# Patient Record
Sex: Male | Born: 1935
Health system: Southern US, Community
[De-identification: ages and names within clinical notes are randomized; demographics above are authoritative.]

## PROBLEM LIST (undated history)

## (undated) DIAGNOSIS — N135 Crossing vessel and stricture of ureter without hydronephrosis: Secondary | ICD-10-CM

## (undated) DIAGNOSIS — E785 Hyperlipidemia, unspecified: Secondary | ICD-10-CM

## (undated) DIAGNOSIS — Z9359 Other cystostomy status: Secondary | ICD-10-CM

## (undated) DIAGNOSIS — C4490 Unspecified malignant neoplasm of skin, unspecified: Secondary | ICD-10-CM

## (undated) DIAGNOSIS — J189 Pneumonia, unspecified organism: Secondary | ICD-10-CM

## (undated) DIAGNOSIS — C61 Malignant neoplasm of prostate: Secondary | ICD-10-CM

## (undated) DIAGNOSIS — I8393 Asymptomatic varicose veins of bilateral lower extremities: Secondary | ICD-10-CM

## (undated) DIAGNOSIS — I714 Abdominal aortic aneurysm, without rupture, unspecified: Secondary | ICD-10-CM

## (undated) DIAGNOSIS — K862 Cyst of pancreas: Secondary | ICD-10-CM

## (undated) DIAGNOSIS — N200 Calculus of kidney: Secondary | ICD-10-CM

## (undated) HISTORY — PX: CATARACT EXTRACTION W/ INTRAOCULAR LENS IMPLANT: SHX1309

## (undated) HISTORY — DX: Malignant neoplasm of prostate: C61

## (undated) HISTORY — PX: TONSILLECTOMY: SUR1361

## (undated) HISTORY — PX: PROSTATE SURGERY: SHX751

## (undated) HISTORY — PX: APPENDECTOMY: SHX54

## (undated) HISTORY — PX: CHOLECYSTECTOMY: SHX55

## (undated) HISTORY — PX: SIGMOIDOSCOPY: SUR1295

## (undated) HISTORY — DX: Unspecified malignant neoplasm of skin, unspecified: C44.90

---

## 2003-05-17 ENCOUNTER — Other Ambulatory Visit: Payer: Self-pay

## 2004-02-13 ENCOUNTER — Other Ambulatory Visit: Payer: Self-pay

## 2004-02-20 ENCOUNTER — Ambulatory Visit: Payer: Self-pay | Admitting: Specialist

## 2005-12-06 ENCOUNTER — Inpatient Hospital Stay: Payer: Self-pay | Admitting: General Surgery

## 2005-12-06 ENCOUNTER — Other Ambulatory Visit: Payer: Self-pay

## 2005-12-07 ENCOUNTER — Other Ambulatory Visit: Payer: Self-pay

## 2006-12-19 ENCOUNTER — Ambulatory Visit: Payer: Self-pay | Admitting: Specialist

## 2008-09-13 ENCOUNTER — Emergency Department: Payer: Self-pay | Admitting: Urology

## 2011-03-24 ENCOUNTER — Ambulatory Visit: Payer: Self-pay | Admitting: Ophthalmology

## 2011-09-17 DIAGNOSIS — R32 Unspecified urinary incontinence: Secondary | ICD-10-CM | POA: Insufficient documentation

## 2011-09-17 DIAGNOSIS — Z87442 Personal history of urinary calculi: Secondary | ICD-10-CM | POA: Insufficient documentation

## 2011-09-17 DIAGNOSIS — Z8546 Personal history of malignant neoplasm of prostate: Secondary | ICD-10-CM | POA: Insufficient documentation

## 2011-09-17 DIAGNOSIS — I8390 Asymptomatic varicose veins of unspecified lower extremity: Secondary | ICD-10-CM | POA: Insufficient documentation

## 2012-09-20 ENCOUNTER — Ambulatory Visit: Payer: Self-pay | Admitting: Ophthalmology

## 2012-09-20 DIAGNOSIS — I517 Cardiomegaly: Secondary | ICD-10-CM

## 2012-09-29 ENCOUNTER — Ambulatory Visit: Payer: Self-pay | Admitting: Ophthalmology

## 2012-10-18 DIAGNOSIS — K862 Cyst of pancreas: Secondary | ICD-10-CM | POA: Insufficient documentation

## 2013-10-03 DIAGNOSIS — R079 Chest pain, unspecified: Secondary | ICD-10-CM | POA: Insufficient documentation

## 2013-10-03 DIAGNOSIS — E782 Mixed hyperlipidemia: Secondary | ICD-10-CM | POA: Insufficient documentation

## 2013-10-03 DIAGNOSIS — R9431 Abnormal electrocardiogram [ECG] [EKG]: Secondary | ICD-10-CM | POA: Insufficient documentation

## 2013-10-03 DIAGNOSIS — I10 Essential (primary) hypertension: Secondary | ICD-10-CM | POA: Insufficient documentation

## 2013-10-03 DIAGNOSIS — R0681 Apnea, not elsewhere classified: Secondary | ICD-10-CM | POA: Insufficient documentation

## 2014-01-09 DIAGNOSIS — N99114 Postprocedural urethral stricture, male, unspecified: Secondary | ICD-10-CM | POA: Insufficient documentation

## 2014-09-08 NOTE — Op Note (Signed)
PATIENT NAME:  Brady Hanna, Brady Hanna MR#:  [id number] DATE OF BIRTH:  [birth year]  DATE OF PROCEDURE:  09/29/2012  PREOPERATIVE DIAGNOSIS: Cataract, left eye.   POSTOPERATIVE DIAGNOSIS: Cataract, left eye.   PROCEDURE PERFORMED: Extracapsular cataract extraction using phacoemulsification with placement Alcon [id number] 23.5-diopter posterior chamber lens, serial [id number].   SURGEON: Loura Back. Ellias Mcelreath, MD   ASSISTANT:  None.  ANESTHESIA: 4% lidocaine and 0.75% Marcaine in a 50:50 mixture with 10 units/mL of Hylenex added, given as a peribulbar.   ANESTHESIOLOGIST: Amy M. Rice, MD   COMPLICATIONS: None.   ESTIMATED BLOOD LOSS: Less than 1 mL.   DESCRIPTION OF PROCEDURE: The patient was brought to the operating room and given a peribulbar block.  The patient was then prepped and draped in the usual fashion.  The vertical rectus muscles were imbricated using 5-0 silk sutures.  These sutures were then clamped to the sterile drapes as bridle sutures.  A limbal peritomy was performed extending two clock hours and hemostasis was obtained with cautery.  A partial thickness scleral groove was made at the surgical limbus and dissected anteriorly in a lamellar dissection using an Alcon crescent knife.  The anterior chamber was entered supero-temporally with a Superblade and through the lamellar dissection with a 2.6 mm keratome.  DisCoVisc was used to replace the aqueous and a continuous tear capsulorrhexis was carried out.  Hydrodissection and hydrodelineation were carried out with balanced salt and a 27 gauge canula.  The nucleus was rotated to confirm the effectiveness of the hydrodissection.  Phacoemulsification was carried out using a divide-and-conquer technique.  Total ultrasound time was 1 minute and 13.7 seconds with an average power of 25.3 percent. CDE 32.52.  Irrigation/aspiration was used to remove the residual cortex.  DisCoVisc was used to inflate the capsule and the internal incision was  enlarged to 3 mm with the crescent knife.  The intraocular lens was folded and inserted into the capsular bag using the AcrySert delivery system.  Irrigation/aspiration was used to remove the residual DisCoVisc.  Miostat was injected into the anterior chamber through the paracentesis track to inflate the anterior chamber and induce miosis.  The wound was checked for leaks and none were found. The conjunctiva was closed with cautery and the bridle sutures were removed.  Two drops of 0.3% Vigamox were placed on the eye.   An eye shield was placed on the eye.  The patient was discharged to the recovery room in good condition.   ____________________________ Loura Back Shantinique Picazo, MD sad:OSi D: 09/29/2012 11:49:49 ET T: 09/29/2012 13:55:17 ET JOB#: [id number]  cc: Remo Lipps A. Bret Vanessen, MD, <Dictator> Martie Lee MD ELECTRONICALLY SIGNED 10/04/2012 14:11

## 2014-10-04 ENCOUNTER — Encounter
Admission: RE | Admit: 2014-10-04 | Discharge: 2014-10-04 | Disposition: A | Discharge status: Home / Self Care | Payer: Medicare Other | Source: Ambulatory Visit | Attending: Urology | Admitting: Urology

## 2014-10-04 DIAGNOSIS — N471 Phimosis: Secondary | ICD-10-CM | POA: Diagnosis present

## 2014-10-04 NOTE — Patient Instructions (Signed)
  Your procedure is scheduled on: Oct 09, 2014 Report to Same Day Surgery. To find out your arrival time please call [phone] between 1PM - 3PM on Oct 06, 2014.  Remember: Instructions that are not followed completely may result in serious medical risk, up to and including death, or upon the discretion of your surgeon and anesthesiologist your surgery may need to be rescheduled.    __x__ 1. Do not eat food or drink liquids after midnight. No gum chewing or hard candies.     ____ 2. No Alcohol for 24 hours before or after surgery.   ____ 3. Bring all medications with you on the day of surgery if instructed.    __x__ 4. Notify your doctor if there is any change in your medical condition     (cold, fever, infections).     Do not wear jewelry, make-up, hairpins, clips or nail polish.  Do not wear lotions, powders, or perfumes. You may wear deodorant.  Do not shave 48 hours prior to surgery. Men may shave face and neck.  Do not bring valuables to the hospital.    [facility] is not responsible for any belongings or valuables.               Contacts, dentures or bridgework may not be worn into surgery.  Leave your suitcase in the car. After surgery it may be brought to your room.  For patients admitted to the hospital, discharge time is determined by your treatment team.   Patients discharged the day of surgery will not be allowed to drive home.   Please read over the following fact sheets that you were given:      ____ Take these medicines the morning of surgery with A SIP OF WATER: NONE    ____ Fleet Enema (as directed)   ____ Use CHG Soap as directed  ____ Use inhalers on the day of surgery  ____ Stop metformin 2 days prior to surgery    ____ Take 1/2 of usual insulin dose the night before surgery and none on the morning of surgery.   ____ Stop Coumadin/Plavix/aspirin on May 15th, 2016.  ____ Stop Anti-inflammatories(Motrin, Advil, Aleve, Ibuprofen) on Oct 04, 2014.   ____ Stop supplements until after surgery.    ____ Bring C-Pap to the hospital.

## 2014-10-06 MED ORDER — LACTATED RINGERS IV SOLN: INTRAVENOUS | Status: DC

## 2014-10-09 ENCOUNTER — Encounter: Payer: Self-pay | Admitting: *Deleted

## 2014-10-09 ENCOUNTER — Ambulatory Visit: Payer: Medicare Other | Admitting: Anesthesiology

## 2014-10-09 ENCOUNTER — Ambulatory Visit
Admission: RE | Admit: 2014-10-09 | Discharge: 2014-10-09 | Disposition: A | Discharge status: Home / Self Care | Payer: Medicare Other | Source: Ambulatory Visit | Attending: Urology | Admitting: Urology

## 2014-10-09 ENCOUNTER — Encounter
Admission: RE | Disposition: A | Discharge status: Home / Self Care | Payer: Self-pay | Source: Ambulatory Visit | Attending: Urology

## 2014-10-09 DIAGNOSIS — Z7982 Long term (current) use of aspirin: Secondary | ICD-10-CM | POA: Diagnosis not present

## 2014-10-09 DIAGNOSIS — R339 Retention of urine, unspecified: Secondary | ICD-10-CM | POA: Insufficient documentation

## 2014-10-09 DIAGNOSIS — D62 Acute posthemorrhagic anemia: Secondary | ICD-10-CM

## 2014-10-09 DIAGNOSIS — Z885 Allergy status to narcotic agent status: Secondary | ICD-10-CM | POA: Insufficient documentation

## 2014-10-09 DIAGNOSIS — N359 Urethral stricture, unspecified: Secondary | ICD-10-CM | POA: Diagnosis not present

## 2014-10-09 DIAGNOSIS — Z9049 Acquired absence of other specified parts of digestive tract: Secondary | ICD-10-CM | POA: Diagnosis not present

## 2014-10-09 DIAGNOSIS — Z9849 Cataract extraction status, unspecified eye: Secondary | ICD-10-CM | POA: Insufficient documentation

## 2014-10-09 DIAGNOSIS — Z882 Allergy status to sulfonamides status: Secondary | ICD-10-CM | POA: Diagnosis not present

## 2014-10-09 DIAGNOSIS — Z79899 Other long term (current) drug therapy: Secondary | ICD-10-CM | POA: Diagnosis not present

## 2014-10-09 DIAGNOSIS — Z412 Encounter for routine and ritual male circumcision: Secondary | ICD-10-CM

## 2014-10-09 DIAGNOSIS — N3040 Irradiation cystitis without hematuria: Secondary | ICD-10-CM | POA: Insufficient documentation

## 2014-10-09 DIAGNOSIS — Z8546 Personal history of malignant neoplasm of prostate: Secondary | ICD-10-CM | POA: Insufficient documentation

## 2014-10-09 DIAGNOSIS — N471 Phimosis: Secondary | ICD-10-CM | POA: Insufficient documentation

## 2014-10-09 HISTORY — PX: CYSTOSCOPY WITH DIRECT VISION INTERNAL URETHROTOMY: SHX6637

## 2014-10-09 HISTORY — PX: CIRCUMCISION: SHX1350

## 2014-10-09 SURGERY — CYSTOSCOPY, WITH DIRECT VISION INTERNAL URETHROTOMY
Anesthesia: General | Wound class: Clean Contaminated

## 2014-10-09 MED ORDER — ACETAMINOPHEN 10 MG/ML IV SOLN
INTRAVENOUS | Status: AC
  Filled 2014-10-09: qty 100

## 2014-10-09 MED ORDER — LIDOCAINE HCL (CARDIAC) 20 MG/ML IV SOLN
INTRAVENOUS | Status: DC | PRN
  Administered 2014-10-09: 100 mg via INTRAVENOUS

## 2014-10-09 MED ORDER — BELLADONNA ALKALOIDS-OPIUM 16.2-60 MG RE SUPP
RECTAL | Status: AC
  Filled 2014-10-09: qty 1

## 2014-10-09 MED ORDER — FENTANYL CITRATE (PF) 100 MCG/2ML IJ SOLN
INTRAMUSCULAR | Status: DC | PRN
  Administered 2014-10-09 (×2): 50 ug via INTRAVENOUS

## 2014-10-09 MED ORDER — ACETAMINOPHEN 10 MG/ML IV SOLN
INTRAVENOUS | Status: DC | PRN
  Administered 2014-10-09: 1000 mg via INTRAVENOUS

## 2014-10-09 MED ORDER — BELLADONNA ALKALOIDS-OPIUM 16.2-60 MG RE SUPP
RECTAL | Status: DC | PRN
  Administered 2014-10-09: 1 via RECTAL

## 2014-10-09 MED ORDER — STERILE WATER FOR IRRIGATION IR SOLN
Status: DC | PRN
  Administered 2014-10-09: 300 mL

## 2014-10-09 MED ORDER — GLYCOPYRROLATE 0.2 MG/ML IJ SOLN
INTRAMUSCULAR | Status: DC | PRN
  Administered 2014-10-09: 0.2 mg via INTRAVENOUS

## 2014-10-09 MED ORDER — LIDOCAINE HCL 1 % IJ SOLN
INTRAMUSCULAR | Status: DC | PRN
  Administered 2014-10-09: 5 mL

## 2014-10-09 MED ORDER — ONDANSETRON HCL 4 MG/2ML IJ SOLN: 4.0000 mg | Freq: Once | INTRAMUSCULAR | Status: DC | PRN

## 2014-10-09 MED ORDER — LACTATED RINGERS IV SOLN
INTRAVENOUS | Status: DC | PRN
  Administered 2014-10-09: 13:00:00 via INTRAVENOUS

## 2014-10-09 MED ORDER — CEFTRIAXONE SODIUM IN DEXTROSE 20 MG/ML IV SOLN
1.0000 g | Freq: Once | INTRAVENOUS | Status: AC
  Administered 2014-10-09: 1 g via INTRAVENOUS
  Filled 2014-10-09 (×2): qty 50

## 2014-10-09 MED ORDER — BUPIVACAINE HCL 0.5 % IJ SOLN
INTRAMUSCULAR | Status: DC | PRN
  Administered 2014-10-09: 30 mL

## 2014-10-09 MED ORDER — MIDAZOLAM HCL 2 MG/2ML IJ SOLN
INTRAMUSCULAR | Status: DC | PRN
  Administered 2014-10-09: 2 mg via INTRAVENOUS

## 2014-10-09 MED ORDER — LIDOCAINE HCL (PF) 1 % IJ SOLN
INTRAMUSCULAR | Status: AC
  Filled 2014-10-09: qty 30

## 2014-10-09 MED ORDER — PROPOFOL 10 MG/ML IV BOLUS
INTRAVENOUS | Status: DC | PRN
  Administered 2014-10-09: 200 mg via INTRAVENOUS

## 2014-10-09 MED ORDER — SODIUM CHLORIDE 0.9 % IV SOLN
INTRAVENOUS | Status: DC
  Administered 2014-10-09: 13:00:00 via INTRAVENOUS

## 2014-10-09 MED ORDER — FAMOTIDINE 20 MG PO TABS
ORAL_TABLET | ORAL | Status: AC
  Administered 2014-10-09: 20 mg via ORAL
  Filled 2014-10-09: qty 1

## 2014-10-09 MED ORDER — OXYCODONE-ACETAMINOPHEN 5-325 MG PO TABS: 1.0000 | ORAL_TABLET | Freq: Four times a day (QID) | ORAL | Status: DC | PRN

## 2014-10-09 MED ORDER — ONDANSETRON HCL 4 MG/2ML IJ SOLN
INTRAMUSCULAR | Status: DC | PRN
  Administered 2014-10-09: 4 mg via INTRAVENOUS

## 2014-10-09 MED ORDER — FAMOTIDINE 20 MG PO TABS
20.0000 mg | ORAL_TABLET | Freq: Once | ORAL | Status: AC
  Administered 2014-10-09: 20 mg via ORAL

## 2014-10-09 MED ORDER — FENTANYL CITRATE (PF) 100 MCG/2ML IJ SOLN: 25.0000 ug | INTRAMUSCULAR | Status: DC | PRN

## 2014-10-09 MED ORDER — BUPIVACAINE HCL (PF) 0.5 % IJ SOLN
INTRAMUSCULAR | Status: AC
  Filled 2014-10-09: qty 30

## 2014-10-09 SURGICAL SUPPLY — 40 items
BAG DRAIN CYSTO-URO LG1000N (MISCELLANEOUS) ×3 IMPLANT
BAG URO DRAIN 2000ML W/SPOUT (MISCELLANEOUS) ×3 IMPLANT
BLADE SURG 15 STRL LF DISP TIS (BLADE) ×1 IMPLANT
BLADE SURG 15 STRL SS (BLADE) ×2
CANISTER SUCT 1200ML W/VALVE (MISCELLANEOUS) ×3 IMPLANT
CATH FOLEY 2WAY  5CC 20FR SIL (CATHETERS) ×2
CATH FOLEY 2WAY 5CC 20FR SIL (CATHETERS) ×1 IMPLANT
DRAPE PED LAPAROTOMY (DRAPES) ×3 IMPLANT
ELECT CAUTERY NEEDLE TIP 1.0 (MISCELLANEOUS) ×3
ELECTRODE CAUTERY NEDL TIP 1.0 (MISCELLANEOUS) ×1 IMPLANT
GAUZE PETROLATUM 1 X8 (GAUZE/BANDAGES/DRESSINGS) ×3 IMPLANT
GAUZE STRETCH 2X75IN STRL (MISCELLANEOUS) ×3 IMPLANT
GLOVE BIO SURGEON STRL SZ7 (GLOVE) ×6 IMPLANT
GLOVE BIO SURGEON STRL SZ7.5 (GLOVE) ×6 IMPLANT
GOWN STRL REUS W/ TWL LRG LVL3 (GOWN DISPOSABLE) ×2 IMPLANT
GOWN STRL REUS W/ TWL XL LVL3 (GOWN DISPOSABLE) ×1 IMPLANT
GOWN STRL REUS W/TWL LRG LVL3 (GOWN DISPOSABLE) ×4
GOWN STRL REUS W/TWL XL LVL3 (GOWN DISPOSABLE) ×2
JELLY LUB 2OZ STRL (MISCELLANEOUS) ×2
JELLY LUBE 2OZ STRL (MISCELLANEOUS) ×1 IMPLANT
KIT RM TURNOVER CYSTO AR (KITS) ×3 IMPLANT
KIT RM TURNOVER STRD PROC AR (KITS) ×3 IMPLANT
LABEL OR SOLS (LABEL) ×3 IMPLANT
NDL SAFETY 25GX1.5 (NEEDLE) ×3 IMPLANT
NS IRRIG 500ML POUR BTL (IV SOLUTION) ×3 IMPLANT
PACK BASIN MINOR ARMC (MISCELLANEOUS) ×3 IMPLANT
PACK CYSTO AR (MISCELLANEOUS) ×3 IMPLANT
PAD GROUND ADULT SPLIT (MISCELLANEOUS) ×3 IMPLANT
PREP PVP WINGED SPONGE (MISCELLANEOUS) ×3 IMPLANT
SENSORWIRE 0.038 NOT ANGLED (WIRE) ×3
SET CYSTO W/LG BORE CLAMP LF (SET/KITS/TRAYS/PACK) ×3 IMPLANT
SOL PREP PVP 2OZ (MISCELLANEOUS) ×3
SOLUTION PREP PVP 2OZ (MISCELLANEOUS) ×1 IMPLANT
STRAP SAFETY BODY (MISCELLANEOUS) ×3 IMPLANT
SUT CHROMIC 3 0 SH 27 (SUTURE) ×9 IMPLANT
SYRINGE 10CC LL (SYRINGE) ×3 IMPLANT
SYRINGE IRR TOOMEY STRL 70CC (SYRINGE) ×3 IMPLANT
WATER STERILE IRR 1000ML POUR (IV SOLUTION) ×3 IMPLANT
WATER STERILE IRR 3000ML UROMA (IV SOLUTION) ×3 IMPLANT
WIRE SENSOR 0.038 NOT ANGLED (WIRE) ×1 IMPLANT

## 2014-10-09 NOTE — Progress Notes (Signed)
preop report given to Dorene Sorrow, RN by Phillips Grout, RN

## 2014-10-09 NOTE — H&P (Signed)
Urology Admission H&P  Chief Complaint: phimosis and urinary retention with a stricture at bulbous urethra  History of Present Illness: several months of retention and slowly tightening  Past Medical History  Diagnosis Date  . Cancer     prostate   Past Surgical History  Procedure Laterality Date  . Cholecystectomy    . Prostate surgery    . Appendectomy    . Tonsillectomy    . Cataract extraction w/ intraocular lens implant Bilateral 2010 & 2012    Home Medications:  Prescriptions prior to admission  Medication Sig Dispense Refill Last Dose  . aspirin 325 MG tablet Take 325 mg by mouth every evening.   10/01/2014  . ibuprofen (ADVIL,MOTRIN) 200 MG tablet Take 200 mg by mouth every 6 (six) hours as needed for mild pain.   10/01/2014   Allergies:  Allergies  Allergen Reactions  . Morphine And Related Itching  . Sulphadimidine [Sulfamethazine] Itching    History reviewed. No pertinent family history. Social History:  reports that he has never smoked. He does not have any smokeless tobacco history on file. He reports that he does not drink alcohol or use illicit drugs.  ROS  Physical Exam:  Vital signs in last 24 hours: Temp:  [98.6 F (37 C)] 98.6 F (37 C) (05/23 1223) Pulse Rate:  [77] 77 (05/23 1223) Resp:  [18] 18 (05/23 1223) BP: (183)/(94) 183/94 mmHg (05/23 1223) SpO2:  [98 %] 98 % (05/23 1223) Weight:  [111.131 kg (245 lb)] 111.131 kg (245 lb) (05/23 1223) Physical Exam  Laboratory Data:  No results found for this or any previous visit (from the past 24 hour(s)). No results found for this or any previous visit (from the past 240 hour(s)). Creatinine: No results for input(s): CREATININE in the last 168 hours. Baseline Creatinine:   Impression/Assessment:  Phimosis urinary retention  Plan:  Cysto dviu and circumcision  Collier Flowers 10/09/2014, 12:47 PM

## 2014-10-09 NOTE — Transfer of Care (Signed)
Immediate Anesthesia Transfer of Care Note  Patient: Brady Hanna  Procedure(s) Performed: Procedure(s): CYSTOSCOPY WITH DIRECT VISION INTERNAL URETHROTOMY (N/A) CIRCUMCISION ADULT (N/A)  Patient Location: PACU  Anesthesia Type:General  Level of Consciousness: sedated and responds to stimulation  Airway & Oxygen Therapy: Patient Spontanous Breathing and Patient connected to face mask oxygen  Post-op Assessment: Report given to RN and Post -op Vital signs reviewed and stable  Post vital signs: Reviewed and stable  Last Vitals:  Filed Vitals:   10/09/14 1424  BP: 112/69  Pulse: 76  Temp: 36.6 C  Resp: 19    Complications: No apparent anesthesia complications

## 2014-10-09 NOTE — Anesthesia Procedure Notes (Signed)
Procedure Name: LMA Insertion Date/Time: 10/09/2014 1:27 PM Performed by: Doreen Salvage Pre-anesthesia Checklist: Patient identified, Patient being monitored, Timeout performed, Emergency Drugs available and Suction available Patient Re-evaluated:Patient Re-evaluated prior to inductionOxygen Delivery Method: Circle system utilized Preoxygenation: Pre-oxygenation with 100% oxygen Intubation Type: IV induction Ventilation: Mask ventilation without difficulty LMA: LMA inserted LMA Size: 4.5 Tube type: Oral Number of attempts: 1 Placement Confirmation: positive ETCO2 and breath sounds checked- equal and bilateral Tube secured with: Tape Dental Injury: Teeth and Oropharynx as per pre-operative assessment

## 2014-10-09 NOTE — Op Note (Signed)
Preoperative diagnosis: Phimosis urethral stricture Postoperative diagnosis: Same  Procedure: Circumcision Direct Vision Internal Urethrotomy Surgeon: Lillette Boxer. Dahlstedt, MD. Anesthesia: Gen. with penile block Specimen: foreskin Complications: none PHX:[id number]  Indications: The patient was recently evaluated for phimosis. All risks and benefits of circumcision discussed. Full informed consent obtained. The patient now presents for definitive procedure.  Technique and findings: The patient was brought to the operating room. Successful induction of general anesthesia.  The patient was then prepped and draped in usual manner. Appropriate surgical timeout was performed. A penile block was then performed with 10cc of quarter percent plain Marcaine. Proximal and distal incision sites were marked with a pen, and appropriate circumferential incisions were created. The sleeve of redundant skin was removed with the bovie. Hemostatis was achieved using electrocautery.Quadrant sutures were placed with interrupted 4-0 chromic, with the phrenular suture being a "U" stitch. In between, the same 4-0 chromic was used to re approximate the skin edges with a running simple stitch.  The incision was wrapped with Xeroform gauze, a plain guaze wrap and Coban dressing. The patient was brought to recovery room in stable condition having had no obvious complications or problems. Sponge and needle counts were correct. Then a 20 fr direct vision internal urethrotomy scope is inserted into the urethra with the patient in the supine lithotomy position.  A membranous urethral stricture is seen with a pinhole opening  A 0.35 sensor wire is placed thru this stricture into the bladder.  Then a sharp sasche knife incision is made into the stricture and the scope is able to view the empty prostate fossa.  Radiation cystitis is seen  But no bladder tumors.  The bladder is emptied and scope withdrawn.  28ml of marcaine is injected into the  bladder.  A BandO suppository is placed into the rectum  An irregularity is felt in the rectum anteriorly and should be checked in the future

## 2014-10-09 NOTE — Anesthesia Postprocedure Evaluation (Signed)
  Anesthesia Post-op Note  Patient: Brady Hanna  Procedure(s) Performed: Procedure(s): CYSTOSCOPY WITH DIRECT VISION INTERNAL URETHROTOMY (N/A) CIRCUMCISION ADULT (N/A)  Anesthesia type:General  Patient location: PACU  Post pain: Pain level controlled  Post assessment: Post-op Vital signs reviewed, Patient's Cardiovascular Status Stable, Respiratory Function Stable, Patent Airway and No signs of Nausea or vomiting  Post vital signs: Reviewed and stable  Last Vitals:  Filed Vitals:   10/09/14 1425  BP:   Pulse: 74  Temp: 36.5 C  Resp: 19    Level of consciousness: awake, alert  and patient cooperative  Complications: No apparent anesthesia complications

## 2014-10-09 NOTE — Discharge Instructions (Addendum)
Ice x 48 hours  And remove dressing in 52 AMBULATORY SURGERY  DISCHARGE INSTRUCTIONS   1) The drugs that you were given will stay in your system until tomorrow so for the next 24 hours you should not:  A) Drive an automobile B) Make any legal decisions C) Drink any alcoholic beverage   2) You may resume regular meals tomorrow.  Today it is better to start with liquids and gradually work up to solid foods.  You may eat anything you prefer, but it is better to start with liquids, then soup and crackers, and gradually work up to solid foods.   3) Please notify your doctor immediately if you have any unusual bleeding, trouble breathing, redness and pain at the surgery site, drainage, fever, or pain not relieved by medication. 4)   5) Your post-operative visit with Dr.    George Ina                                 is: Date:                        Time:    Please call to schedule your post-operative visit.  6) Additional Instructions: hours  Remove the foley in 48 hours at our office 10/20/14 @ 10:15

## 2014-10-09 NOTE — Anesthesia Preprocedure Evaluation (Addendum)
Anesthesia Evaluation  Patient identified by MRN, date of birth, ID band Patient awake    Reviewed: Allergy & Precautions, H&P , NPO status , Patient's Chart, lab work & pertinent test results  Airway Mallampati: III  TM Distance: >3 FB     Dental  (+) Chipped, Missing   Pulmonary          Cardiovascular     Neuro/Psych    GI/Hepatic   Endo/Other    Renal/GU      Musculoskeletal   Abdominal   Peds  Hematology   Anesthesia Other Findings Prostate cancer.  Reproductive/Obstetrics                             Anesthesia Physical Anesthesia Plan  ASA: III  Anesthesia Plan: General   Post-op Pain Management:    Induction: Intravenous  Airway Management Planned: LMA  Additional Equipment:   Intra-op Plan:   Post-operative Plan:   Informed Consent: I have reviewed the patients History and Physical, chart, labs and discussed the procedure including the risks, benefits and alternatives for the proposed anesthesia with the patient or authorized representative who has indicated his/her understanding and acceptance.     Plan Discussed with: CRNA  Anesthesia Plan Comments:        Anesthesia Quick Evaluation

## 2014-10-10 ENCOUNTER — Encounter: Payer: Self-pay | Admitting: Urology

## 2014-10-12 ENCOUNTER — Encounter: Payer: Self-pay | Admitting: *Deleted

## 2014-10-12 ENCOUNTER — Other Ambulatory Visit: Payer: Self-pay | Admitting: *Deleted

## 2014-10-20 ENCOUNTER — Ambulatory Visit (INDEPENDENT_AMBULATORY_CARE_PROVIDER_SITE_OTHER): Payer: Medicare Other | Admitting: Urology

## 2014-10-20 ENCOUNTER — Encounter: Payer: Self-pay | Admitting: Urology

## 2014-10-20 ENCOUNTER — Ambulatory Visit: Payer: Self-pay | Admitting: Urology

## 2014-10-20 VITALS — BP 167/88 | HR 75

## 2014-10-20 DIAGNOSIS — R32 Unspecified urinary incontinence: Secondary | ICD-10-CM

## 2014-10-20 DIAGNOSIS — Z9889 Other specified postprocedural states: Secondary | ICD-10-CM

## 2014-10-20 LAB — BLADDER SCAN AMB NON-IMAGING: Scan Result: 0

## 2014-10-20 NOTE — Progress Notes (Signed)
10/20/2014 10:16 AM   Brady Hanna [birth year] [id number]  Referring provider: Kirk Ruths, MD [place], [place] [address]  Chief Complaint  Patient presents with  . Post-op Problem    pt concern that his incsion site is not healing right,because of the hair. He has a knot on the area thats causing him difficulty urinating.    HPI: [name] is a one-week postop circumcision with complaint of slow healing and a hematoma. He has an organized hematoma on the right side of his incision. He is angry because I did not shave his hair for circumcision. It was no need to shave his hair for circumcision and this hematoma will resolve. He still has some scarring on the frenular area otherwise circumcision actually looks very well-healed and hematoma longer is having the edema and swelling where his foreskin was constantly under trip from his incontinence. He is not voiding well so I think he is probably had a re-formation of his scar tissue in his urethra. This stricture is side at the level of the bulbous urethra. A post void residual is obtained today  post void residual is 10-15 mL   PMH: Past Medical History  Diagnosis Date  . Cancer     prostate    Surgical History: Past Surgical History  Procedure Laterality Date  . Cholecystectomy    . Prostate surgery    . Appendectomy    . Tonsillectomy    . Cataract extraction w/ intraocular lens implant Bilateral 2010 & 2012  . Cystoscopy with direct vision internal urethrotomy N/A 10/09/2014    Procedure: CYSTOSCOPY WITH DIRECT VISION INTERNAL URETHROTOMY;  Surgeon: Brady Flowers, MD;  Location: [facility] ORS;  Service: Urology;  Laterality: N/A;  . Circumcision N/A 10/09/2014    Procedure: CIRCUMCISION ADULT;  Surgeon: Brady Flowers, MD;  Location: [facility] ORS;  Service: Urology;  Laterality: N/A;    Home Medications:    Medication List       This list is accurate as of: 10/20/14 10:16 AM.  Always use your most recent  med list.               aspirin 325 MG tablet  Take 325 mg by mouth every evening.     fluticasone 50 MCG/ACT nasal spray  Commonly known as:  FLONASE  Place into the nose.     ibuprofen 200 MG tablet  Commonly known as:  ADVIL,MOTRIN  Take 200 mg by mouth every 6 (six) hours as needed for mild pain.     oxyCODONE-acetaminophen 5-325 MG per tablet  Commonly known as:  ROXICET  Take 1 tablet by mouth every 6 (six) hours as needed for severe pain.     sildenafil 100 MG tablet  Commonly known as:  VIAGRA  Take by mouth.        Allergies:  Allergies  Allergen Reactions  . Codeine     "makes him agitated" per daughter  . Morphine And Related Itching  . Sulphadimidine [Sulfamethazine] Itching    Family History: Family History  Problem Relation Age of Onset  . Congestive Heart Failure Father   . Rectal cancer Sister   . Prostate cancer Brother     Social History:  reports that he has never smoked. He does not have any smokeless tobacco history on file. He reports that he does not drink alcohol or use illicit drugs.  ROS: Urological Symptom Review  Patient is experiencing the following symptoms: Leakage of  urine Weak stream   Review of Systems  Gastrointestinal (upper)  : Negative for upper GI symptoms  Gastrointestinal (lower) : Negative for lower GI symptoms  Constitutional : Negative for symptoms  Skin: Negative for skin symptoms  Eyes: Negative for eye symptoms  Ear/Nose/Throat : Negative for Ear/Nose/Throat symptoms  Hematologic/Lymphatic: Negative for Hematologic/Lymphatic symptoms  Cardiovascular : Negative for cardiovascular symptoms  Respiratory : Negative for respiratory symptoms  Endocrine: Negative for endocrine symptoms  Musculoskeletal: Negative for musculoskeletal symptoms  Neurological: Negative for neurological symptoms  Psychologic: Negative for psychiatric symptoms   Physical Exam: BP 167/88 mmHg  Pulse 75    Constitutional:  Alert and oriented, No acute distress. HEENT: Brady Hanna AT, moist mucus membranes.  Trachea midline, no masses. Cardiovascular: No clubbing, cyanosis, or edema. Respiratory: Normal respiratory effort, no increased work of breathing. GI: Abdomen is soft, nontender, nondistended, no abdominal masses GU: No CVA tenderness. He has good healing of his circumcision site with small resolving hematoma on the right side. With his postvoid residual being minimal patient has true incontinence. Skin: No rashes, bruises or suspicious lesions. Lymph: No cervical or inguinal adenopathy. Neurologic: Grossly intact, no focal deficits, moving all 4 extremities. Psychiatric: Normal mood and affect.  Laboratory Data: No results found for: WBC, HGB, HCT, MCV, PLT  No results found for: CREATININE  No results found for: PSA  No results found for: TESTOSTERONE  No results found for: HGBA1C  Urinalysis No results found for: COLORURINE, APPEARANCEUR, LABSPEC, PHURINE, GLUCOSEU, HGBUR, BILIRUBINUR, KETONESUR, PROTEINUR, UROBILINOGEN, NITRITE, LEUKOCYTESUR  Pertinent Imaging: I'll  Assessment & Plan:  I discussed with this patient the need for another opinion regarding his incontinence I'll follow-up with him in a month for reevaluation of his well-healing circumcision site. Patient states that he'll wait for a month until he sees me in a decision about whether to see a another incontinence specialist  There are no diagnoses linked to this encounter.  No Follow-up on file.  [place] [address], [place], [place] [address] [phone]

## 2014-11-21 ENCOUNTER — Encounter: Payer: Self-pay | Admitting: Urology

## 2014-11-21 ENCOUNTER — Ambulatory Visit (INDEPENDENT_AMBULATORY_CARE_PROVIDER_SITE_OTHER): Payer: Medicare Other | Admitting: Urology

## 2014-11-21 VITALS — BP 109/72 | HR 111 | Ht 72.0 in | Wt 240.7 lb

## 2014-11-21 DIAGNOSIS — N359 Urethral stricture, unspecified: Secondary | ICD-10-CM | POA: Diagnosis not present

## 2014-11-21 DIAGNOSIS — N3946 Mixed incontinence: Secondary | ICD-10-CM

## 2014-11-21 LAB — BLADDER SCAN AMB NON-IMAGING

## 2014-11-21 NOTE — Progress Notes (Signed)
11/21/2014 3:23 PM   [name] 04/21/1931 [id number]  Referring provider: Kirk Ruths, MD [place],  [id number]  Chief Complaint  Patient presents with  . Urethral Stricture    HPI: Patient has a circumcision site that is healing well. No scar tissue was present which was his complaint on the last visit. He still feels his penis is retracted into his mons pubis but this is only because there is no skin left around the glans. He still is totally incontinent when she has been since surgery for his prostate cancer. Patient refuses a sphincter or a workup for possible stress incontinence surgery for a male secondarily is complaining of urgency and frequency at night 4 to combat this I have placed him on a regimen of 10 mg Vesicare at at bedtime and given him samples for a months worth of this and will see him at the end of that   PMH: Past Medical History  Diagnosis Date  . Prostate cancer     prostate  . Skin cancer     ear    Surgical History: Past Surgical History  Procedure Laterality Date  . Cholecystectomy    . Prostate surgery    . Appendectomy    . Tonsillectomy    . Cataract extraction w/ intraocular lens implant Bilateral 2010 & 2012  . Cystoscopy with direct vision internal urethrotomy N/A 10/09/2014    Procedure: CYSTOSCOPY WITH DIRECT VISION INTERNAL URETHROTOMY;  Surgeon: Collier Flowers, MD;  Location: [facility];  Service: Urology;  Laterality: N/A;  . Circumcision N/A 10/09/2014    Procedure: CIRCUMCISION ADULT;  Surgeon: Collier Flowers, MD;  Location: [facility];  Service: Urology;  Laterality: N/A;    Home Medications:    Medication List       This list is accurate as of: 11/21/14  3:23 PM.  Always use your most recent med list.               aspirin 325 MG tablet  Take 325 mg by mouth every evening.     fluticasone 50 MCG/ACT nasal spray  Commonly known as:  FLONASE  Place into the nose.     ibuprofen 200 MG tablet   Commonly known as:  ADVIL,MOTRIN  Take 200 mg by mouth every 6 (six) hours as needed for mild pain.     sildenafil 100 MG tablet  Commonly known as:  VIAGRA  Take by mouth.        Allergies:  Allergies  Allergen Reactions  . Codeine     "makes him agitated" per daughter  . Morphine And Related Itching  . Sulphadimidine [Sulfamethazine] Itching    Family History: Family History  Problem Relation Age of Onset  . Congestive Heart Failure Father   . Rectal cancer Sister   . Prostate cancer Brother     Social History:  reports that he has never smoked. He does not have any smokeless tobacco history on file. He reports that he does not drink alcohol or use illicit drugs.  FFM:[id number] Frequent Urination?: No Hard to postpone urination?: No Burning/pain with urination?: No Get up at night to urinate?: Yes Leakage of urine?: Yes Urine stream starts and stops?: Yes Trouble starting stream?: Yes Do you have to strain to urinate?: No Blood in urine?: No Urinary tract infection?: No Sexually transmitted disease?: No Injury to kidneys or bladder?: No Painful intercourse?: No Weak stream?: Yes Erection problems?: Yes Penile pain?: No  Gastrointestinal Nausea?: No Vomiting?: No Indigestion/heartburn?: No Diarrhea?: No Constipation?: No Constitutional Fever: No Night sweats?: No Weight loss?: No Fatigue?: No Skin Skin rash/lesions?: No Itching?: No Eyes Blurred vision?: No Double vision?: No Ears/Nose/Throat Sore throat?: No Sinus problems?: No Hematologic/Lymphatic Swollen glands?: No Easy bruising?: No Cardiovascular Leg swelling?: No Chest pain?: No Respiratory Cough?: No Shortness of breath?: No Endocrine Excessive thirst?: No Psychologic Depression?: No Anxiety?: No     Physical Exam: BP 109/72 mmHg  Pulse 111  Ht 6' (1.829 m)  Wt 240 lb 11.2 oz (109.181 kg)  BMI 32.64 kg/m2  Constitutional:  Alert and oriented, No acute distress. HEENT:  [place] AT, moist mucus membranes.  Trachea midline, no masses. Cardiovascular: No clubbing, cyanosis, or edema. Respiratory: Normal respiratory effort, no increased work of breathing. GI: Abdomen is soft, nontender, nondistended, no abdominal masses GU: No CVA tenderness. circumcision site healing well but still with incontinence and chronic Skin: No rashes, bruises or suspicious lesions. Lymph: No cervical or inguinal adenopathy. Neurologic: Grossly intact, no focal deficits, moving all 4 extremities. Psychiatric: Normal mood and affect.  Laboratory Data: No results found for: WBC, HGB, HCT, MCV, PLT  No results found for: CREATININE  No results found for: PSA  No results found for: TESTOSTERONE  No results found for: HGBA1C  Urinalysis No results found for: COLORURINE, APPEARANCEUR, LABSPEC, PHURINE, GLUCOSEU, HGBUR, BILIRUBINUR, KETONESUR, PROTEINUR, UROBILINOGEN, NITRITE, LEUKOCYTESUR  Pertinent Imaging: PVR of 17  Assessment & Plan: Incontinence and not nighttime urgency and frequency of urination 4. Plan is Vesicare 10 mg at at bedtime daily recheck in a month to see how he is doing  1. Urethral stricture, unspecified location, unspecified stricture type incontinence secondary to radical prostatectomy - BLADDER SCAN AMB NON-IMAGING  2. Urinary incontinence, mixed space status post prostate cancer. Status post circumcision for phimosis - BLADDER SCAN AMB NON-IMAGING   No Follow-up on file.  Collier Flowers, [facility] [address] [place], [place] [address] [phone]

## 2015-07-13 ENCOUNTER — Encounter: Payer: Self-pay | Admitting: Urology

## 2015-07-13 ENCOUNTER — Ambulatory Visit (INDEPENDENT_AMBULATORY_CARE_PROVIDER_SITE_OTHER): Payer: Medicare Other | Admitting: Urology

## 2015-07-13 VITALS — BP 153/87 | HR 76 | Ht 72.0 in | Wt 246.7 lb

## 2015-07-13 DIAGNOSIS — R32 Unspecified urinary incontinence: Secondary | ICD-10-CM

## 2015-07-13 DIAGNOSIS — Z8546 Personal history of malignant neoplasm of prostate: Secondary | ICD-10-CM | POA: Diagnosis not present

## 2015-07-13 NOTE — Progress Notes (Signed)
07/13/2015 3:50 PM   [name] [birth year] [id number]  Referring provider: Kirk Ruths, MD [place] [facility] [place], [place] [address]  Chief Complaint  Patient presents with  . Follow-up  . Urinary Incontinence    HPI: The patient is an 82 year old male who is status post DVIU and circumcision in May 2016 and prostate cancer s/p prostate cancer who presents with incontinence. He reports that it's been continuous since his prostatectomy in 1994. He states that he never has to void since it's contents leaking. He has to wear depends at all times. He finds is very bothersome. He seen many doctors for this before without much help. He is very concerned about his quality of life from this continuous incontinence from his prostatectomy. He   He has a history of prostate cancer. His prostatectomy was in 1994. His PSA was checked last year which was undetectable. PMH: Past Medical History  Diagnosis Date  . Prostate cancer [facility]     prostate  . Skin cancer     ear    Surgical History: Past Surgical History  Procedure Laterality Date  . Cholecystectomy    . Prostate surgery    . Appendectomy    . Tonsillectomy    . Cataract extraction w/ intraocular lens implant Bilateral 2010 & 2012  . Cystoscopy with direct vision internal urethrotomy N/A 10/09/2014    Procedure: CYSTOSCOPY WITH DIRECT VISION INTERNAL URETHROTOMY;  Surgeon: Collier Flowers, MD;  Location: [facility] OR[facility];  Service: Urology;  Laterality: N/A;  . Circumcision N/A 10/09/2014    Procedure: CIRCUMCISION ADULT;  Surgeon: Collier Flowers, MD;  Location: [facility] OR[facility];  Service: Urology;  Laterality: N/A;    Home Medications:    Medication List       This list is accurate as of: 07/13/15  3:50 PM.  Always use your most recent med list.               aspirin 325 MG tablet  Take 325 mg by mouth every evening.     fluticasone 50 MCG/ACT nasal spray  Commonly known as:  FLONASE  Place  into the nose. Reported on 07/13/2015     ibuprofen 200 MG tablet  Commonly known as:  ADVIL,MOTRIN  Take 200 mg by mouth every 6 (six) hours as needed for mild pain.     pantoprazole 40 MG tablet  Commonly known as:  PROTONIX     sildenafil 100 MG tablet  Commonly known as:  VIAGRA  Take by mouth. Reported on 07/13/2015        Allergies:  Allergies  Allergen Reactions  . Codeine     "makes him agitated" per daughter  . Morphine And Related Itching  . Sulphadimidine [Sulfamethazine] Itching    Family History: Family History  Problem Relation Age of Onset  . Congestive Heart Failure Father   . Rectal cancer Sister   . Prostate cancer Brother     Social History:  reports that he has never smoked. He does not have any smokeless tobacco history on file. He reports that he does not drink alcohol or use illicit drugs.  ROS: UROLOGY Frequent Urination?: No Hard to postpone urination?: No Burning/pain with urination?: No Get up at night to urinate?: Yes Leakage of urine?: No Urine stream starts and stops?: No Trouble starting stream?: No Do you have to strain to urinate?: No Blood in urine?: No Urinary tract infection?: No Sexually transmitted disease?: No  Injury to kidneys or bladder?: No Painful intercourse?: No Weak stream?: No Erection problems?: Yes Penile pain?: No  Gastrointestinal Nausea?: No Vomiting?: No Indigestion/heartburn?: No Diarrhea?: No Constipation?: No  Constitutional Fever: No Night sweats?: No Weight loss?: No Fatigue?: No  Skin Skin rash/lesions?: No Itching?: No  Eyes Blurred vision?: No Double vision?: No  Ears/Nose/Throat Sore throat?: No Sinus problems?: Yes  Hematologic/Lymphatic Swollen glands?: No Easy bruising?: No  Cardiovascular Leg swelling?: No Chest pain?: No  Respiratory Cough?: No Shortness of breath?: No  Endocrine Excessive thirst?: No  Musculoskeletal Back pain?: No Joint pain?:  No  Neurological Headaches?: No Dizziness?: No  Psychologic Depression?: No Anxiety?: No  Physical Exam: BP 153/87 mmHg  Pulse 76  Ht 6' (1.829 m)  Wt 246 lb 11.2 oz (111.902 kg)  BMI 33.45 kg/m2  Constitutional:  Alert and oriented, No acute distress. HEENT: [place] AT, moist mucus membranes.  Trachea midline, no masses. Cardiovascular: No clubbing, cyanosis, or edema. Respiratory: Normal respiratory effort, no increased work of breathing. GI: Abdomen is soft, nontender, nondistended, no abdominal masses GU: No CVA tenderness. Normal phallus. Testicles descended equally bilaterally. Skin: No rashes, bruises or suspicious lesions. Lymph: No cervical or inguinal adenopathy. Neurologic: Grossly intact, no focal deficits, moving all 4 extremities. Psychiatric: Normal mood and affect.  Laboratory Data: No results found for: WBC, HGB, HCT, MCV, PLT  No results found for: CREATININE  No results found for: PSA  No results found for: TESTOSTERONE  No results found for: HGBA1C  Urinalysis No results found for: COLORURINE, APPEARANCEUR, LABSPEC, PHURINE, GLUCOSEU, HGBUR, BILIRUBINUR, KETONESUR, PROTEINUR, UROBILINOGEN, NITRITE, LEUKOCYTESUR    Assessment & Plan:    1. Continuous urinary incontinence I have ordered a juvenile Cunningham clamp for the patient to try for his continuous incontinence since his prostatectomy 22 years ago. He will attempt to obtain this device and follow-up in 2 weeks for demonstration.  2. History of prostate cancer status post prostatectomy No evidence of disease  Return in about 2 weeks (around 07/27/2015) for for [facility] clamp demonstration.  Nickie Retort, MD  [facility] [address] [place], [place] [address] [phone]

## 2015-08-01 ENCOUNTER — Ambulatory Visit: Payer: Medicare Other

## 2015-08-02 ENCOUNTER — Ambulatory Visit: Payer: Medicare Other | Admitting: Urology

## 2015-08-10 ENCOUNTER — Encounter: Payer: Self-pay | Admitting: Urology

## 2015-08-10 ENCOUNTER — Ambulatory Visit (INDEPENDENT_AMBULATORY_CARE_PROVIDER_SITE_OTHER): Payer: Medicare Other | Admitting: Urology

## 2015-08-10 ENCOUNTER — Telehealth: Payer: Self-pay

## 2015-08-10 VITALS — BP 175/83 | HR 74 | Ht 72.0 in | Wt 250.0 lb

## 2015-08-10 DIAGNOSIS — R32 Unspecified urinary incontinence: Secondary | ICD-10-CM

## 2015-08-10 DIAGNOSIS — Z8546 Personal history of malignant neoplasm of prostate: Secondary | ICD-10-CM | POA: Diagnosis not present

## 2015-08-10 DIAGNOSIS — R3915 Urgency of urination: Secondary | ICD-10-CM

## 2015-08-10 MED ORDER — MIRABEGRON ER 25 MG PO TB24: 25.0000 mg | ORAL_TABLET | Freq: Every day | ORAL | Status: DC

## 2015-08-10 NOTE — Telephone Encounter (Signed)
Pt called stating when he got home he was having urinary leakage and blood even though the penis clamp was still on. Per Dr. Pilar Jarvis pt should come back in for a nurse visit to be taught again how to apply clamp. Pt stated that he had not removed the clamp since Dr. Pilar Jarvis had applied it. Pt also stated that he did not want to come back in today. Reinforced with pt to drink plenty of water to help flush his system and to call back should the bleeding get worse before 5:00. Reinforced with pt should he develop n/v, f/c, dysuria over the weekend he should go to the ER. Pt voiced understanding.

## 2015-08-10 NOTE — Progress Notes (Signed)
08/10/2015 10:08 AM   Brady Hanna 1930/12/03 [id number]  Referring provider: Kirk Ruths, MD [place] [facility] [place], [place] [address]  No chief complaint on file.   HPI: The patient is an 82 year old male who is status post DVIU and circumcision in May 2016 and prostate cancer s/p prostate cancer who presents with incontinence. He reports that it's been continuous since his prostatectomy in 1994. He states that he never has to void since it's contents leaking. He has to wear depends at all times. He finds is very bothersome. He seen many doctors for this before without much help. He is very concerned about his quality of life from this continuous incontinence from his prostatectomy. He   He has a history of prostate cancer. His prostatectomy was in 1994. His PSA was checked last year which was undetectable.    PMH: Past Medical History  Diagnosis Date  . Prostate cancer [facility]     prostate  . Skin cancer     ear    Surgical History: Past Surgical History  Procedure Laterality Date  . Cholecystectomy    . Prostate surgery    . Appendectomy    . Tonsillectomy    . Cataract extraction w/ intraocular lens implant Bilateral 2010 & 2012  . Cystoscopy with direct vision internal urethrotomy N/A 10/09/2014    Procedure: CYSTOSCOPY WITH DIRECT VISION INTERNAL URETHROTOMY;  Surgeon: Collier Flowers, MD;  Location: [facility] OR[facility];  Service: Urology;  Laterality: N/A;  . Circumcision N/A 10/09/2014    Procedure: CIRCUMCISION ADULT;  Surgeon: Collier Flowers, MD;  Location: [facility] OR[facility];  Service: Urology;  Laterality: N/A;    Home Medications:    Medication List       This list is accurate as of: 08/10/15 10:08 AM.  Always use your most recent med list.               aspirin 325 MG tablet  Take 325 mg by mouth every evening.     fluticasone 50 MCG/ACT nasal spray  Commonly known as:  FLONASE  Place into the nose. Reported on 07/13/2015     ibuprofen 200 MG tablet  Commonly known as:  ADVIL,MOTRIN  Take 200 mg by mouth every 6 (six) hours as needed for mild pain.     pantoprazole 40 MG tablet  Commonly known as:  PROTONIX     sildenafil 100 MG tablet  Commonly known as:  VIAGRA  Take by mouth. Reported on 07/13/2015        Allergies:  Allergies  Allergen Reactions  . Codeine     "makes him agitated" per daughter  . Morphine And Related Itching  . Sulphadimidine [Sulfamethazine] Itching    Family History: Family History  Problem Relation Age of Onset  . Congestive Heart Failure Father   . Rectal cancer Sister   . Prostate cancer Brother     Social History:  reports that he has never smoked. He does not have any smokeless tobacco history on file. He reports that he does not drink alcohol or use illicit drugs.  ROS: UROLOGY Frequent Urination?: No Hard to postpone urination?: No Burning/pain with urination?: No Get up at night to urinate?: Yes Leakage of urine?: Yes Urine stream starts and stops?: Yes Trouble starting stream?: Yes Do you have to strain to urinate?: Yes Blood in urine?: No Urinary tract infection?: No Sexually transmitted disease?: No Injury to kidneys or bladder?: No Painful intercourse?:  No Weak stream?: No Erection problems?: Yes Penile pain?: No  Gastrointestinal Nausea?: No Vomiting?: No Indigestion/heartburn?: Yes Diarrhea?: No Constipation?: No  Constitutional Fever: No Night sweats?: No Weight loss?: No Fatigue?: No  Skin Skin rash/lesions?: No Itching?: No  Eyes Blurred vision?: No Double vision?: No  Ears/Nose/Throat Sore throat?: No Sinus problems?: Yes  Hematologic/Lymphatic Swollen glands?: No Easy bruising?: No  Cardiovascular Leg swelling?: No Chest pain?: No  Respiratory Cough?: No Shortness of breath?: No  Endocrine Excessive thirst?: No  Musculoskeletal Back pain?: No Joint pain?: No  Neurological Headaches?: No Dizziness?:  No  Psychologic Depression?: No Anxiety?: No  Physical Exam: BP 175/83 mmHg  Pulse 74  Ht 6' (1.829 m)  Wt 250 lb (113.399 kg)  BMI 33.90 kg/m2  Constitutional:  Alert and oriented, No acute distress. HEENT: [place] AT, moist mucus membranes.  Trachea midline, no masses. Cardiovascular: No clubbing, cyanosis, or edema. Respiratory: Normal respiratory effort, no increased work of breathing. GI: Abdomen is soft, nontender, nondistended, no abdominal masses GU: No CVA tenderness.  Skin: No rashes, bruises or suspicious lesions. Lymph: No cervical or inguinal adenopathy. Neurologic: Grossly intact, no focal deficits, moving all 4 extremities. Psychiatric: Normal mood and affect.  Laboratory Data: No results found for: WBC, HGB, HCT, MCV, PLT  No results found for: CREATININE  No results found for: PSA  No results found for: TESTOSTERONE  No results found for: HGBA1C  Urinalysis No results found for: COLORURINE, APPEARANCEUR, LABSPEC, PHURINE, GLUCOSEU, HGBUR, BILIRUBINUR, KETONESUR, PROTEINUR, UROBILINOGEN, NITRITE, LEUKOCYTESUR  Assessment & Plan:    1. Continuous urinary incontinence The patient was taught how to use his Cunningham clamp today. He was instructed to only go to one click as his incontinence immediately stopped after this.  2. History of prostate cancer status post prostatectomy No evidence of disease. Check PSA in one year  3. Urinary urgency and The patient has urinary urgency particularly when he lays down. He was given samples Myrbetriq 25 mg daily. Prescriptions also called into his pharmacy.   He will follow-up in 3 months discuss his progress the [facility] clamp and Myrbetriq. He will he will call to schedule appointment sooner if his symptoms worsen or fail to improve.   Return in about 3 months (around 11/10/2015).  Nickie Retort, MD  [facility] [address], [place],  [address] [phone]

## 2015-11-15 ENCOUNTER — Ambulatory Visit: Payer: Medicare Other

## 2015-12-24 ENCOUNTER — Encounter: Payer: Medicare Other | Attending: Surgery | Admitting: Surgery

## 2015-12-24 DIAGNOSIS — E785 Hyperlipidemia, unspecified: Secondary | ICD-10-CM | POA: Insufficient documentation

## 2015-12-24 DIAGNOSIS — Z86718 Personal history of other venous thrombosis and embolism: Secondary | ICD-10-CM | POA: Insufficient documentation

## 2015-12-24 DIAGNOSIS — S81812A Laceration without foreign body, left lower leg, initial encounter: Secondary | ICD-10-CM | POA: Diagnosis present

## 2015-12-24 DIAGNOSIS — Z7982 Long term (current) use of aspirin: Secondary | ICD-10-CM | POA: Insufficient documentation

## 2015-12-24 DIAGNOSIS — X58XXXA Exposure to other specified factors, initial encounter: Secondary | ICD-10-CM | POA: Diagnosis not present

## 2015-12-24 DIAGNOSIS — I10 Essential (primary) hypertension: Secondary | ICD-10-CM | POA: Diagnosis not present

## 2015-12-24 DIAGNOSIS — Z8546 Personal history of malignant neoplasm of prostate: Secondary | ICD-10-CM | POA: Insufficient documentation

## 2015-12-25 NOTE — Progress Notes (Addendum)
JAXSTEN, ATCITTY ([id number]) Visit Report for 12/24/2015 Allergy List Details Patient Name: Brady Hanna, Brady Hanna. Date of Service: 12/24/2015 1:30 PM Medical Record Number: [id number] Patient Account Number: [id number] Date of Birth/Sex: 1930/05/28 (82 y.o. Male) Treating RN: Cornell Barman Primary Care Physician: Frazier Richards Other Clinician: Referring Physician: Briscoe Deutscher Treating Physician/Extender: Frann Rider in Treatment: 0 Allergies Active Allergies codeine Sulfa (Sulfonamide Antibiotics) morphine Allergy Notes Electronic Signature(s) Signed: 12/24/2015 5:30:41 PM By: Gretta Cool, RN, BSN, Kim RN, BSN Entered By: Gretta Cool, RN, BSN, Kim on 12/24/2015 13:42:30 Gaiser, Trudie Reed ([id number]) -------------------------------------------------------------------------------- Arrival Information Details Patient Name: Brady Hanna, Brady Hanna. Date of Service: 12/24/2015 1:30 PM Medical Record Number: [id number] Patient Account Number: [id number] Date of Birth/Sex: 03-31-1931 (82 y.o. Male) Treating RN: Cornell Barman Primary Care Physician: Frazier Richards Other Clinician: Referring Physician: Briscoe Deutscher Treating Physician/Extender: Frann Rider in Treatment: 0 Visit Information Patient Arrived: Ambulatory Arrival Time: 13:29 Accompanied By: self Transfer Assistance: None Patient Identification Verified: Yes Secondary Verification Process Yes Completed: Patient Requires Transmission- No Based Precautions: Patient Has Alerts: Yes Patient Alerts: Patient on Blood Thinner 81mg  aspirin Left ABI: Non- compress Electronic Signature(s) Signed: 12/24/2015 5:30:41 PM By: Gretta Cool, RN, BSN, Kim RN, BSN Entered By: Gretta Cool, RN, BSN, Kim on 12/24/2015 13:37:26 Tamplin, Trudie Reed ([id number]) -------------------------------------------------------------------------------- Clinic Level of Care Assessment Details Patient Name: Brady Hanna, Brady Hanna. Date of Service: 12/24/2015 1:30 PM Medical Record  Number: [id number] Patient Account Number: [id number] Date of Birth/Sex: 22-Jun-1930 (82 y.o. Male) Treating RN: Cornell Barman Primary Care Physician: Frazier Richards Other Clinician: Referring Physician: Briscoe Deutscher Treating Physician/Extender: Frann Rider in Treatment: 0 Clinic Level of Care Assessment Items TOOL 2 Quantity Score []  - Use when only an EandM is performed on the INITIAL visit 0 ASSESSMENTS - Nursing Assessment / Reassessment X - General Physical Exam (combine w/ comprehensive assessment (listed just 1 20 below) when performed on new pt. evals) X - Comprehensive Assessment (HX, ROS, Risk Assessments, Wounds Hx, etc.) 1 25 ASSESSMENTS - Wound and Skin Assessment / Reassessment X - Simple Wound Assessment / Reassessment - one wound 1 5 []  - Complex Wound Assessment / Reassessment - multiple wounds 0 []  - Dermatologic / Skin Assessment (not related to wound area) 0 ASSESSMENTS - Ostomy and/or Continence Assessment and Care []  - Incontinence Assessment and Management 0 []  - Ostomy Care Assessment and Management (repouching, etc.) 0 PROCESS - Coordination of Care X - Simple Patient / Family Education for ongoing care 1 15 []  - Complex (extensive) Patient / Family Education for ongoing care 0 X - Staff obtains Programmer, systems, Records, Test Results / Process Orders 1 10 []  - Staff telephones HHA, Nursing Homes / Clarify orders / etc 0 []  - Routine Transfer to another Facility (non-emergent condition) 0 []  - Routine Hospital Admission (non-emergent condition) 0 []  - New Admissions / Biomedical engineer / Ordering NPWT, Apligraf, etc. 0 []  - Emergency Hospital Admission (emergent condition) 0 X - Simple Discharge Coordination 1 10 Friedmann, Noboru F. ([id number]) []  - Complex (extensive) Discharge Coordination 0 PROCESS - Special Needs []  - Pediatric / Minor Patient Management 0 []  - Isolation Patient Management 0 []  - Hearing / Language / Visual special needs 0 []  -  Assessment of Community assistance (transportation, D/C planning, etc.) 0 []  - Additional assistance / Altered mentation 0 []  - Support Surface(s) Assessment (bed, cushion, seat, etc.) 0 INTERVENTIONS - Wound Cleansing / Measurement X - Wound Imaging (photographs - any number of wounds) 1 5 []  - Wound Tracing (  instead of photographs) 0 X - Simple Wound Measurement - one wound 1 5 []  - Complex Wound Measurement - multiple wounds 0 X - Simple Wound Cleansing - one wound 1 5 []  - Complex Wound Cleansing - multiple wounds 0 INTERVENTIONS - Wound Dressings X - Small Wound Dressing one or multiple wounds 1 10 []  - Medium Wound Dressing one or multiple wounds 0 []  - Large Wound Dressing one or multiple wounds 0 []  - Application of Medications - injection 0 INTERVENTIONS - Miscellaneous []  - External ear exam 0 []  - Specimen Collection (cultures, biopsies, blood, body fluids, etc.) 0 []  - Specimen(s) / Culture(s) sent or taken to Lab for analysis 0 []  - Patient Transfer (multiple staff / Civil Service fast streamer / Similar devices) 0 []  - Simple Staple / Suture removal (25 or less) 0 []  - Complex Staple / Suture removal (26 or more) 0 Chesney, Logun F. ([id number]) []  - Hypo / Hyperglycemic Management (close monitor of Blood Glucose) 0 X - Ankle / Brachial Index (ABI) - do not check if billed separately 1 15 Has the patient been seen at the hospital within the last three years: Yes Total Score: 125 Level Of Care: New/Established - Level 4 Electronic Signature(s) Signed: 12/24/2015 5:30:41 PM By: Gretta Cool, RN, BSN, Kim RN, BSN Entered By: Gretta Cool, RN, BSN, Kim on 12/24/2015 15:08:00 Voiles, Trudie Reed ([id number]) -------------------------------------------------------------------------------- Encounter Discharge Information Details Patient Name: Brady Hanna, Brady Hanna. Date of Service: 12/24/2015 1:30 PM Medical Record Number: [id number] Patient Account Number: [id number] Date of Birth/Sex: 1931/03/05 (82 y.o. Male) Treating  RN: Cornell Barman Primary Care Physician: Frazier Richards Other Clinician: Referring Physician: Briscoe Deutscher Treating Physician/Extender: Frann Rider in Treatment: 0 Encounter Discharge Information Items Discharge Pain Level: 0 Discharge Condition: Stable Ambulatory Status: Ambulatory Discharge Destination: Home Transportation: Private Auto Accompanied By: self Schedule Follow-up Appointment: Yes Medication Reconciliation completed and provided to Patient/Care Yes Laporshia Hogen: Provided on Clinical Summary of Care: 12/24/2015 Form Type Recipient Paper Patient WM Electronic Signature(s) Signed: 12/24/2015 5:30:41 PM By: Gretta Cool RN, BSN, Kim RN, BSN Previous Signature: 12/24/2015 2:29:58 PM Version By: Ruthine Dose Entered By: Gretta Cool RN, BSN, Kim on 12/24/2015 15:13:51 Kendrix, Trudie Reed ([id number]) -------------------------------------------------------------------------------- Lower Extremity Assessment Details Patient Name: Brady Hanna, Brady Hanna. Date of Service: 12/24/2015 1:30 PM Medical Record Number: [id number] Patient Account Number: [id number] Date of Birth/Sex: 11-16-1930 (82 y.o. Male) Treating RN: Cornell Barman Primary Care Physician: Frazier Richards Other Clinician: Referring Physician: Briscoe Deutscher Treating Physician/Extender: Frann Rider in Treatment: 0 Vascular Assessment Claudication: Claudication Assessment [Left:None] Pulses: Posterior Tibial Palpable: [Left:Yes] Doppler: [Left:Monophasic] Dorsalis Pedis Palpable: [Left:Yes] Doppler: [Left:Monophasic] Extremity colors, hair growth, and conditions: Extremity Color: [Left:Normal] Hair Growth on Extremity: [Left:Yes] Temperature of Extremity: [Left:Warm] Capillary Refill: [Left:< 3 seconds] Toe Nail Assessment Left: Right: Thick: Yes Discolored: Yes Deformed: Yes Improper Length and Hygiene: Yes Electronic Signature(s) Signed: 12/24/2015 5:30:41 PM By: Gretta Cool, RN, BSN, Kim RN, BSN Entered By:  Gretta Cool, RN, BSN, Kim on 12/24/2015 13:36:57 Masur, Trudie Reed ([id number]) -------------------------------------------------------------------------------- Multi Wound Chart Details Patient Name: Brady Hanna. Date of Service: 12/24/2015 1:30 PM Medical Record Number: [id number] Patient Account Number: [id number] Date of Birth/Sex: [birth year] (82 y.o. Male) Treating RN: Cornell Barman Primary Care Physician: Frazier Richards Other Clinician: Referring Physician: Briscoe Deutscher Treating Physician/Extender: Frann Rider in Treatment: 0 Vital Signs Height(in): 72 Pulse(bpm): 85 Weight(lbs): 245 Blood Pressure 162/91 (mmHg): Body Mass Index(BMI): 33 Temperature(F): 97.9 Respiratory Rate 20 (breaths/min): Photos: [N/A:N/A] Wound Location: Left Lower Leg N/A N/A Wounding Event:  Trauma N/A N/A Primary Etiology: Trauma, Other N/A N/A Date Acquired: 08/20/2015 N/A N/A Weeks of Treatment: 0 N/A N/A Wound Status: Open N/A N/A Measurements L x W x D 4x5x0.1 N/A N/A (cm) Area (cm) : 15.708 N/A N/A Volume (cm) : 1.571 N/A N/A Classification: Partial Thickness N/A N/A Exudate Amount: None Present N/A N/A Wound Margin: Flat and Intact N/A N/A Granulation Amount: None Present (0%) N/A N/A Necrotic Amount: Large (67-100%) N/A N/A Necrotic Tissue: Eschar N/A N/A Periwound Skin Texture: Edema: No N/A N/A Excoriation: No Induration: No Callus: No Crepitus: No Fluctuance: No Friable: No PETTER, SWARD. ([id number]) Rash: No Scarring: No Periwound Skin Dry/Scaly: Yes N/A N/A Moisture: Maceration: No Moist: No Periwound Skin Color: Ecchymosis: Yes N/A N/A Atrophie Blanche: No Cyanosis: No Erythema: No Hemosiderin Staining: No Mottled: No Pallor: No Rubor: No Tenderness on No N/A N/A Palpation: Wound Preparation: Ulcer Cleansing: Not N/A N/A Cleansed Topical Anesthetic Applied: Other: Lidocaine 4% Treatment Notes Wound #1 (Left Lower Leg) 1. Cleansed with: Clean  wound with Normal Saline 2. Anesthetic Topical Lidocaine 4% cream to wound bed prior to debridement 4. Dressing Applied: Other dressing (specify in notes) 7. Secured with Self adhesive bandage Electronic Signature(s) Signed: 12/24/2015 5:30:41 PM By: Gretta Cool, RN, BSN, Kim RN, BSN Entered By: Gretta Cool, RN, BSN, Kim on 12/24/2015 14:40:30 Lupa, Trudie Reed ([id number]) -------------------------------------------------------------------------------- [place] Plan Details Patient Name: Brady Hanna, Brady Hanna. Date of Service: 12/24/2015 1:30 PM Medical Record Number: [id number] Patient Account Number: [id number] Date of Birth/Sex: [birth year] (82 y.o. Male) Treating RN: Cornell Barman Primary Care Physician: Frazier Richards Other Clinician: Referring Physician: Briscoe Deutscher Treating Physician/Extender: Frann Rider in Treatment: 0 Active Inactive Electronic Signature(s) Signed: 01/16/2016 2:03:17 PM By: Gretta Cool, RN, BSN, Kim RN, BSN Previous Signature: 12/24/2015 5:30:41 PM Version By: Gretta Cool, RN, BSN, Kim RN, BSN Entered By: Gretta Cool, RN, BSN, Kim on 01/16/2016 14:03:17 Mcmenamin, Trudie Reed ([id number]) -------------------------------------------------------------------------------- Pain Assessment Details Patient Name: Brady Hanna, Brady Hanna. Date of Service: 12/24/2015 1:30 PM Medical Record Number: [id number] Patient Account Number: [id number] Date of Birth/Sex: 1931/01/28 (82 y.o. Male) Treating RN: Cornell Barman Primary Care Physician: Frazier Richards Other Clinician: Referring Physician: Briscoe Deutscher Treating Physician/Extender: Frann Rider in Treatment: 0 Active Problems Location of Pain Severity and Description of Pain Patient Has Paino No Site Locations With Dressing Change: Yes Duration of the Pain. Constant / Intermittento Intermittent Character of Pain Describe the Pain: Tender Pain Management and Medication Current Pain Management: Electronic Signature(s) Signed:  12/24/2015 5:30:41 PM By: Gretta Cool, RN, BSN, Kim RN, BSN Entered By: Gretta Cool, RN, BSN, Kim on 12/24/2015 13:37:55 Kouba, Trudie Reed ([id number]) -------------------------------------------------------------------------------- Patient/Caregiver Education Details Patient Name: Brady Hanna. Date of Service: 12/24/2015 1:30 PM Medical Record Number: [id number] Patient Account Number: [id number] Date of Birth/Gender: Oct 11, 1930 (82 y.o. Male) Treating RN: Cornell Barman Primary Care Physician: Frazier Richards Other Clinician: Referring Physician: Briscoe Deutscher Treating Physician/Extender: Frann Rider in Treatment: 0 Education Assessment Education Provided To: Patient Education Topics Provided Venous: Welcome [facility]: Wound/Skin Impairment: Handouts: Caring for Your Ulcer, Other: look for drainage Methods: Demonstration Responses: State content correctly Electronic Signature(s) Signed: 12/24/2015 5:30:41 PM By: Gretta Cool, RN, BSN, Kim RN, BSN Entered By: Gretta Cool, RN, BSN, Kim on 12/24/2015 15:16:30 Osorto, Trudie Reed ([id number]) -------------------------------------------------------------------------------- Wound Assessment Details Patient Name: Brady Hanna, Brady Hanna. Date of Service: 12/24/2015 1:30 PM Medical Record Number: [id number] Patient Account Number: [id number] Date of Birth/Sex: April 22, 1931 (82 y.o. Male) Treating RN: Cornell Barman Primary Care Physician: Frazier Richards  Other Clinician: Referring Physician: Briscoe Deutscher Treating Physician/Extender: Frann Rider in Treatment: 0 Wound Status Wound Number: 1 Primary Etiology: Trauma, Other Wound Location: Left Lower Leg Wound Status: Open Wounding Event: Trauma Date Acquired: 08/20/2015 Weeks Of Treatment: 0 Clustered Wound: No Photos Wound Measurements Length: (cm) 4 % Reduction in Width: (cm) 5 % Reduction in Depth: (cm) 0.1 Tunneling: Area: (cm) 15.708 Undermining: Volume: (cm)  1.571 Area: Volume: No No Wound Description Classification: Partial Thickness Wound Margin: Flat and Intact Exudate Amount: None Present Foul Odor After Cleansing: No Wound Bed Granulation Amount: None Present (0%) Necrotic Amount: Large (67-100%) Necrotic Quality: Eschar Periwound Skin Texture Texture Color No Abnormalities Noted: No No Abnormalities Noted: No Callus: No Atrophie Blanche: No GERRON, BIRELEY ([id number]) Crepitus: No Cyanosis: No Excoriation: No Ecchymosis: Yes Fluctuance: No Erythema: No Friable: No Hemosiderin Staining: No Induration: No Mottled: No Localized Edema: No Pallor: No Rash: No Rubor: No Scarring: No Moisture No Abnormalities Noted: No Dry / Scaly: Yes Maceration: No Moist: No Wound Preparation Ulcer Cleansing: Not Cleansed Topical Anesthetic Applied: Other: Lidocaine 4%, Electronic Signature(s) Signed: 12/24/2015 5:30:41 PM By: Gretta Cool, RN, BSN, Kim RN, BSN Entered By: Gretta Cool, RN, BSN, Kim on 12/24/2015 13:40:41 Neyhart, Trudie Reed ([id number]) -------------------------------------------------------------------------------- [place] Details Patient Name: Brady Hanna. Date of Service: 12/24/2015 1:30 PM Medical Record Number: [id number] Patient Account Number: [id number] Date of Birth/Sex: November 04, 1930 (82 y.o. Male) Treating RN: Cornell Barman Primary Care Physician: Frazier Richards Other Clinician: Referring Physician: Briscoe Deutscher Treating Physician/Extender: Frann Rider in Treatment: 0 Vital Signs Time Taken: 13:30 Temperature (F): 97.9 Height (in): 72 Pulse (bpm): 85 Source: Stated Respiratory Rate (breaths/min): 20 Weight (lbs): 245 Blood Pressure (mmHg): 162/91 Source: Stated Reference Range: 80 - 120 mg / dl Body Mass Index (BMI): 33.2 Electronic Signature(s) Signed: 12/24/2015 5:30:41 PM By: Gretta Cool, RN, BSN, Kim RN, BSN Entered By: Gretta Cool, RN, BSN, Kim on 12/24/2015 13:32:48

## 2015-12-25 NOTE — Progress Notes (Signed)
Brady Hanna, Brady Hanna ([id number]) Visit Report for 12/24/2015 Abuse/Suicide Risk Screen Details Patient Name: Brady Hanna, Brady Hanna. Date of Service: 12/24/2015 1:30 PM Medical Record Number: [id number] Patient Account Number: [id number] Date of Birth/Sex: 1931/05/06 (82 y.o. Male) Treating RN: Cornell Barman Primary Care Physician: Frazier Richards Other Clinician: Referring Physician: Briscoe Deutscher Treating Physician/Extender: Frann Rider in Treatment: 0 Abuse/Suicide Risk Screen Items Answer ABUSE/SUICIDE RISK SCREEN: Has anyone close to you tried to hurt or harm you recentlyo No Do you feel uncomfortable with anyone in your familyo No Has anyone forced you do things that you didnot want to doo No Do you have any thoughts of harming yourselfo No Patient displays signs or symptoms of abuse and/or neglect. No Electronic Signature(s) Signed: 12/24/2015 5:30:41 PM By: Gretta Cool, RN, BSN, Kim RN, BSN Entered By: Gretta Cool, RN, BSN, Kim on 12/24/2015 13:47:21 Brady Hanna, Brady Hanna ([id number]) -------------------------------------------------------------------------------- Activities of Daily Living Details Patient Name: Brady Hanna, Brady Hanna. Date of Service: 12/24/2015 1:30 PM Medical Record Number: [id number] Patient Account Number: [id number] Date of Birth/Sex: Apr 17, 1931 (82 y.o. Male) Treating RN: Cornell Barman Primary Care Physician: Frazier Richards Other Clinician: Referring Physician: Briscoe Deutscher Treating Physician/Extender: Frann Rider in Treatment: 0 Activities of Daily Living Items Answer Activities of Daily Living (Please select one for each item) Drive Automobile Completely Able Take Medications Completely Able Use Telephone Completely Able Care for Appearance Completely Able Use Toilet Completely Able Bath / Shower Completely Able Dress Self Completely Able Feed Self Completely Able Walk Completely Able Get In / Out Bed Completely Able Housework Completely Able Prepare Meals  Completely Able Handle Money Completely Able Shop for Self Completely Able Electronic Signature(s) Signed: 12/24/2015 5:30:41 PM By: Gretta Cool, RN, BSN, Kim RN, BSN Entered By: Gretta Cool, RN, BSN, Kim on 12/24/2015 13:47:32 Brady Hanna, Brady Hanna ([id number]) -------------------------------------------------------------------------------- Education Assessment Details Patient Name: Brady Hanna. Date of Service: 12/24/2015 1:30 PM Medical Record Number: [id number] Patient Account Number: [id number] Date of Birth/Sex: 07-29-1930 (82 y.o. Male) Treating RN: Cornell Barman Primary Care Physician: Frazier Richards Other Clinician: Referring Physician: Briscoe Deutscher Treating Physician/Extender: Frann Rider in Treatment: 0 Primary Learner Assessed: Patient Learning Preferences/Education Level/Primary Language Learning Preference: Explanation Highest Education Level: High School Preferred Language: English Cognitive Barrier Assessment/Beliefs Language Barrier: No Translator Needed: No Memory Deficit: No Emotional Barrier: No Cultural/Religious Beliefs Affecting Medical No Care: Physical Barrier Assessment Impaired Vision: Yes Glasses Impaired Hearing: No Decreased Hand dexterity: No Knowledge/Comprehension Assessment Knowledge Level: High Comprehension Level: High Ability to understand written High instructions: Ability to understand verbal High instructions: Motivation Assessment Anxiety Level: Calm Cooperation: Cooperative Education Importance: Acknowledges Need Interest in Health Problems: Asks Questions Perception: Coherent Willingness to Engage in Self- High Management Activities: Readiness to Engage in Self- High Management Activities: Electronic Signature(s) Brady Hanna, Brady Hanna ([id number]) Signed: 12/24/2015 5:30:41 PM By: Gretta Cool, RN, BSN, Kim RN, BSN Entered By: Gretta Cool, RN, BSN, Kim on 12/24/2015 13:48:10 Brady Hanna, Brady Hanna  ([id number]) -------------------------------------------------------------------------------- Fall Risk Assessment Details Patient Name: Brady Hanna. Date of Service: 12/24/2015 1:30 PM Medical Record Number: [id number] Patient Account Number: [id number] Date of Birth/Sex: 05/31/1930 (82 y.o. Male) Treating RN: Cornell Barman Primary Care Physician: Frazier Richards Other Clinician: Referring Physician: Briscoe Deutscher Treating Physician/Extender: Frann Rider in Treatment: 0 Fall Risk Assessment Items Have you had 2 or more falls in the last 12 monthso 0 No Have you had any fall that resulted in injury in the last 12 monthso 0 No FALL RISK ASSESSMENT: History of falling - immediate or within 3  months 0 No Secondary diagnosis 0 No Ambulatory aid None/bed rest/wheelchair/nurse 0 Yes Crutches/cane/walker 0 No Furniture 0 No IV Access/Saline Lock 0 No Gait/Training Normal/bed rest/immobile 0 Yes Weak 0 No Impaired 0 No Mental Status Oriented to own ability 0 Yes Electronic Signature(s) Signed: 12/24/2015 5:30:41 PM By: Gretta Cool, RN, BSN, Kim RN, BSN Entered By: Gretta Cool, RN, BSN, Kim on 12/24/2015 13:48:26 Brady Hanna, Brady Hanna ([id number]) -------------------------------------------------------------------------------- Foot Assessment Details Patient Name: Brady Hanna. Date of Service: 12/24/2015 1:30 PM Medical Record Number: [id number] Patient Account Number: [id number] Date of Birth/Sex: 27-Feb-1931 (82 y.o. Male) Treating RN: Cornell Barman Primary Care Physician: Frazier Richards Other Clinician: Referring Physician: Briscoe Deutscher Treating Physician/Extender: Frann Rider in Treatment: 0 Foot Assessment Items Site Locations + = Sensation present, - = Sensation absent, C = Callus, U = Ulcer R = Redness, W = Warmth, M = Maceration, PU = Pre-ulcerative lesion F = Fissure, S = Swelling, D = Dryness Assessment Right: Left: Other Deformity: No No Prior Foot Ulcer: No  No Prior Amputation: No No Charcot Joint: No No Ambulatory Status: Ambulatory Without Help Gait: Steady Electronic Signature(s) Signed: 12/24/2015 5:30:41 PM By: Gretta Cool, RN, BSN, Kim RN, BSN Entered By: Gretta Cool, RN, BSN, Kim on 12/24/2015 13:49:36 Brady Hanna, Brady Hanna ([id number]) -------------------------------------------------------------------------------- Nutrition Risk Assessment Details Patient Name: Brady Hanna. Date of Service: 12/24/2015 1:30 PM Medical Record Number: [id number] Patient Account Number: [id number] Date of Birth/Sex: 12-28-1930 (82 y.o. Male) Treating RN: Cornell Barman Primary Care Physician: Frazier Richards Other Clinician: Referring Physician: Briscoe Deutscher Treating Physician/Extender: Frann Rider in Treatment: 0 Height (in): 72 Weight (lbs): 245 Body Mass Index (BMI): 33.2 Nutrition Risk Assessment Items NUTRITION RISK SCREEN: I have an illness or condition that made me change the kind and/or 0 No amount of food I eat I eat fewer than two meals per day 0 No I eat few fruits and vegetables, or milk products 0 No I have three or more drinks of beer, liquor or wine almost every day 0 No I have tooth or mouth problems that make it hard for me to eat 0 No I don't always have enough money to buy the food I need 0 No I eat alone most of the time 0 No I take three or more different prescribed or over-the-counter drugs a 0 No day Without wanting to, I have lost or gained 10 pounds in the last six 0 No months I am not always physically able to shop, cook and/or feed myself 0 No Nutrition Protocols Good Risk Protocol 0 No interventions needed Moderate Risk Protocol Electronic Signature(s) Signed: 12/24/2015 5:30:41 PM By: Gretta Cool, RN, BSN, Kim RN, BSN Entered By: Gretta Cool, RN, BSN, Kim on 12/24/2015 13:48:45

## 2015-12-25 NOTE — Progress Notes (Signed)
TOMMIE, JAVENS ([id number]) Visit Report for 12/24/2015 Chief Complaint Document Details Patient Name: Brady Hanna, Brady Hanna. Date of Service: 12/24/2015 1:30 PM Medical Record Number: [id number] Patient Account Number: [id number] Date of Birth/Sex: 11/24/1930 (82 y.o. Male) Treating RN: Cornell Barman Primary Care Physician: Frazier Richards Other Clinician: Referring Physician: Briscoe Deutscher Treating Physician/Extender: Frann Rider in Treatment: 0 Information Obtained from: Patient Chief Complaint Patient seen for complaints of Non-Healing Wound to the left lower extremity which he's had for about 4 month Electronic Signature(s) Signed: 12/24/2015 2:45:19 PM By: Christin Fudge MD, FACS Entered By: Christin Fudge on 12/24/2015 14:45:19 Stall, Trudie Reed ([id number]) -------------------------------------------------------------------------------- HPI Details Patient Name: Brady Hanna. Date of Service: 12/24/2015 1:30 PM Medical Record Number: [id number] Patient Account Number: [id number] Date of Birth/Sex: 1930-11-23 (82 y.o. Male) Treating RN: Cornell Barman Primary Care Physician: Frazier Richards Other Clinician: Referring Physician: Briscoe Deutscher Treating Physician/Extender: Frann Rider in Treatment: 0 History of Present Illness Location: left lower extremity Quality: Patient reports No Pain. Severity: Patient states wound are getting worse. Duration: Patient has had the wound for > 3 months prior to seeking treatment at the wound center Context: The wound occurred when the patient was injured with a tree limb and is convinced that there is deep embedded splinters. Modifying Factors: Other treatment(s) tried include:hydrogen peroxide and disinfectant Associated Signs and Symptoms: Patient reports has no discharge. HPI Description: 82 year old gentleman who had a injury with a tree limb in early April 2017, as had a nonhealing wound since then. He has been applying abrasive  material like hydrogen peroxide and some disinfectant and he keeps saying that the wounds open up and do not heal. He also reports a redness in that area in the past. He may have also been treated with an antibiotic by his PCP. There is no history of having an x-ray taken of this area. Otherwise very healthy and has not had any past medical history noted except some hyperlipidemia and hypertension. Electronic Signature(s) Signed: 12/24/2015 2:49:19 PM By: Christin Fudge MD, FACS Entered By: Christin Fudge on 12/24/2015 14:49:19 Jaroszewski, Trudie Reed ([id number]) -------------------------------------------------------------------------------- Physical Exam Details Patient Name: Brady Hanna. Date of Service: 12/24/2015 1:30 PM Medical Record Number: [id number] Patient Account Number: [id number] Date of Birth/Sex: May 13, 1931 (82 y.o. Male) Treating RN: Cornell Barman Primary Care Physician: Frazier Richards Other Clinician: Referring Physician: Briscoe Deutscher Treating Physician/Extender: Frann Rider in Treatment: 0 Constitutional . Pulse regular. Respirations normal and unlabored. Afebrile. . Eyes Nonicteric. Reactive to light. Ears, Nose, Mouth, and Throat Lips, teeth, and gums WNL.Marland Kitchen Moist mucosa without lesions. Neck supple and nontender. No palpable supraclavicular or cervical adenopathy. Normal sized without goiter. Respiratory WNL. No retractions.. Cardiovascular Pedal Pulses WNL. ABIs were noncompressible. No evidence of lymphedema. Gastrointestinal (GI) Abdomen without masses or tenderness.. No liver or spleen enlargement or tenderness.. Lymphatic No adneopathy. No adenopathy. No adenopathy. Musculoskeletal Adexa without tenderness or enlargement.. Digits and nails w/o clubbing, cyanosis, infection, petechiae, ischemia, or inflammatory conditions.. Integumentary (Hair, Skin) No suspicious lesions. No crepitus or fluctuance. No peri-wound warmth or erythema. No  masses.Marland Kitchen Psychiatric Judgement and insight Intact.. No evidence of depression, anxiety, or agitation.. Notes the patient has 3 superficial scabs on the left lower extremity and these were gently removed with a forcep and there are no open wounds. palpation of this area does not reveal any evidence of induration of foreign body under the skin. Electronic Signature(s) Signed: 12/24/2015 2:50:48 PM By: Christin Fudge MD, FACS Entered By: Christin Fudge on  12/24/2015 14:50:47 ASAHI, SOYARS ([id number]) -------------------------------------------------------------------------------- Physician Orders Details Patient Name: Brady Hanna. Date of Service: 12/24/2015 1:30 PM Medical Record Number: [id number] Patient Account Number: [id number] Date of Birth/Sex: April 08, 1931 (82 y.o. Male) Treating RN: Cornell Barman Primary Care Physician: Frazier Richards Other Clinician: Referring Physician: Briscoe Deutscher Treating Physician/Extender: Frann Rider in Treatment: 0 Verbal / Phone Orders: Yes Clinician: Cornell Barman Read Back and Verified: Yes Diagnosis Coding Wound Cleansing o Cleanse wound with mild soap and water - No scrubbing Primary Wound Dressing Wound #1 Left Lower Leg o Other: - self-adherent bandage (check daily for drainage) Follow-up Appointments Wound #1 Left Lower Leg o Return Appointment in 1 week. Electronic Signature(s) Signed: 12/24/2015 3:30:17 PM By: Christin Fudge MD, FACS Signed: 12/24/2015 5:30:41 PM By: Gretta Cool RN, BSN, Kim RN, BSN Entered By: Gretta Cool, RN, BSN, Kim on 12/24/2015 15:12:22 LIBAN, JOYA ([id number]) -------------------------------------------------------------------------------- Problem List Details Patient Name: Brady Hanna. Date of Service: 12/24/2015 1:30 PM Medical Record Number: [id number] Patient Account Number: [id number] Date of Birth/Sex: 09-25-1930 (82 y.o. Male) Treating RN: Cornell Barman Primary Care Physician: Frazier Richards Other  Clinician: Referring Physician: Briscoe Deutscher Treating Physician/Extender: Frann Rider in Treatment: 0 Active Problems ICD-10 Encounter Code Description Active Date Diagnosis S81.812A Laceration without foreign body, left lower leg, initial 12/24/2015 Yes encounter Inactive Problems Resolved Problems Electronic Signature(s) Signed: 12/24/2015 2:44:36 PM By: Christin Fudge MD, FACS Entered By: Christin Fudge on 12/24/2015 14:44:35 Surber, Trudie Reed ([id number]) -------------------------------------------------------------------------------- Progress Note Details Patient Name: Brady Hanna. Date of Service: 12/24/2015 1:30 PM Medical Record Number: [id number] Patient Account Number: [id number] Date of Birth/Sex: Dec 25, 1930 (82 y.o. Male) Treating RN: Cornell Barman Primary Care Physician: Frazier Richards Other Clinician: Referring Physician: Briscoe Deutscher Treating Physician/Extender: Frann Rider in Treatment: 0 Subjective Chief Complaint Information obtained from Patient Patient seen for complaints of Non-Healing Wound to the left lower extremity which he's had for about 4 month History of Present Illness (HPI) The following HPI elements were documented for the patient's wound: Location: left lower extremity Quality: Patient reports No Pain. Severity: Patient states wound are getting worse. Duration: Patient has had the wound for > 3 months prior to seeking treatment at the wound center Context: The wound occurred when the patient was injured with a tree limb and is convinced that there is deep embedded splinters. Modifying Factors: Other treatment(s) tried include:hydrogen peroxide and disinfectant Associated Signs and Symptoms: Patient reports has no discharge. 82 year old gentleman who had a injury with a tree limb in early April 2017, as had a nonhealing wound since then. He has been applying abrasive material like hydrogen peroxide and some disinfectant and  he keeps saying that the wounds open up and do not heal. He also reports a redness in that area in the past. He may have also been treated with an antibiotic by his PCP. There is no history of having an x-ray taken of this area. Otherwise very healthy and has not had any past medical history noted except some hyperlipidemia and hypertension. Wound History Patient presents with 3 open wounds that have been present for approximately 4 months. Patient has been treating wounds in the following manner: peroxide; abx ointment. The wounds have been healed in the past but have re-opened. Laboratory tests have not been performed in the last month. Patient reportedly has not tested positive for an antibiotic resistant organism. Patient reportedly has not tested positive for osteomyelitis. Patient reportedly has not had testing performed to evaluate circulation in the  legs. Patient experiences the following problems associated with their wounds: swelling. Patient History Information obtained from Patient. Allergies codeine, Sulfa (Sulfonamide Antibiotics), morphine Brisbin, MIKIAH SIRKO ([id number]) Family History Heart Disease - Mother, No family history of Cancer, Diabetes, Hypertension, Kidney Disease, Lung Disease, Seizures, Stroke, Thyroid Problems. Social History Never smoker, Marital Status - Married, Alcohol Use - Never, Drug Use - No History, Caffeine Use - Daily. Medical History Eyes Patient has history of Cataracts - removed Denies history of Glaucoma, Optic Neuritis Ear/Nose/Mouth/Throat Denies history of Chronic sinus problems/congestion, Middle ear problems Hematologic/Lymphatic Denies history of Anemia, Hemophilia, Human Immunodeficiency Virus, Lymphedema, Sickle Cell Disease Respiratory Denies history of Aspiration, Asthma, Chronic Obstructive Pulmonary Disease (COPD), Pneumothorax, Sleep Apnea, Tuberculosis Cardiovascular Patient has history of Deep Vein Thrombosis - 1994 Denies  history of Angina, Arrhythmia, Congestive Heart Failure, Coronary Artery Disease, Hypertension, Hypotension, Myocardial Infarction, Peripheral Arterial Disease, Peripheral Venous Disease, Phlebitis, Vasculitis Gastrointestinal Denies history of Cirrhosis , Colitis, Crohn s, Hepatitis A, Hepatitis B Endocrine Denies history of Type I Diabetes, Type II Diabetes Genitourinary Denies history of End Stage Renal Disease Immunological Denies history of Lupus Erythematosus, Raynaud s, Scleroderma Integumentary (Skin) Denies history of History of Burn, History of pressure wounds Musculoskeletal Denies history of Gout, Rheumatoid Arthritis, Osteoarthritis, Osteomyelitis Neurologic Denies history of Dementia, Neuropathy, Quadriplegia, Paraplegia, Seizure Disorder Oncologic Denies history of Received Chemotherapy, Received Radiation Psychiatric Denies history of Anorexia/bulimia, Confinement Anxiety Review of Systems (ROS) Constitutional Symptoms (General Health) The patient has no complaints or symptoms. Eyes Complains or has symptoms of Glasses / Contacts. Denies complaints or symptoms of Dry Eyes, Vision Changes. DAH, EURESTI ([id number]) Ear/Nose/Mouth/Throat The patient has no complaints or symptoms. Hematologic/Lymphatic The patient has no complaints or symptoms. Respiratory The patient has no complaints or symptoms. Cardiovascular The patient has no complaints or symptoms. Gastrointestinal The patient has no complaints or symptoms. Endocrine The patient has no complaints or symptoms. Genitourinary The patient has no complaints or symptoms. Immunological The patient has no complaints or symptoms. Integumentary (Skin) Complains or has symptoms of Wounds. Denies complaints or symptoms of Bleeding or bruising tendency, Breakdown, Swelling. Musculoskeletal The patient has no complaints or symptoms. Neurologic The patient has no complaints or symptoms. Oncologic The  patient has no complaints or symptoms, Prostate cancer 1994 Psychiatric The patient has no complaints or symptoms. Medications Adult Low Dose Aspirin 81 mg tablet,delayed release oral tablet,delayed release (DR/EC) oral doxycycline hyclate 100 mg capsule oral 1 capsule oral BID for 7 days Objective Constitutional Pulse regular. Respirations normal and unlabored. Afebrile. Vitals Time Taken: 1:30 PM, Height: 72 in, Source: Stated, Weight: 245 lbs, Source: Stated, BMI: 33.2, Temperature: 97.9 F, Pulse: 85 bpm, Respiratory Rate: 20 breaths/min, Blood Pressure: 162/91 mmHg. Eyes Oblinger, HOANG ZANT. ([id number]) Nonicteric. Reactive to light. Ears, Nose, Mouth, and Throat Lips, teeth, and gums WNL.Marland Kitchen Moist mucosa without lesions. Neck supple and nontender. No palpable supraclavicular or cervical adenopathy. Normal sized without goiter. Respiratory WNL. No retractions.. Cardiovascular Pedal Pulses WNL. ABIs were noncompressible. No evidence of lymphedema. Gastrointestinal (GI) Abdomen without masses or tenderness.. No liver or spleen enlargement or tenderness.. Lymphatic No adneopathy. No adenopathy. No adenopathy. Musculoskeletal Adexa without tenderness or enlargement.. Digits and nails w/o clubbing, cyanosis, infection, petechiae, ischemia, or inflammatory conditions.Marland Kitchen Psychiatric Judgement and insight Intact.. No evidence of depression, anxiety, or agitation.. General Notes: the patient has 3 superficial scabs on the left lower extremity and these were gently removed with a forcep and there are no open wounds. palpation of this area  does not reveal any evidence of induration of foreign body under the skin. Integumentary (Hair, Skin) No suspicious lesions. No crepitus or fluctuance. No peri-wound warmth or erythema. No masses.. Wound #1 status is Open. Original cause of wound was Trauma. The wound is located on the Left Lower Leg. The wound measures 4cm length x 5cm width x 0.1cm  depth; 15.708cm^2 area and 1.571cm^3 volume. There is no tunneling or undermining noted. There is a none present amount of drainage noted. The wound margin is flat and intact. There is no granulation within the wound bed. There is a large (67- 100%) amount of necrotic tissue within the wound bed including Eschar. The periwound skin appearance exhibited: Dry/Scaly, Ecchymosis. The periwound skin appearance did not exhibit: Callus, Crepitus, Excoriation, Fluctuance, Friable, Induration, Localized Edema, Rash, Scarring, Maceration, Moist, Atrophie Blanche, Cyanosis, Hemosiderin Staining, Mottled, Pallor, Rubor, Erythema. Assessment MELAKI, ARGUMEDO ([id number]) Active Problems ICD-10 [id number] - Laceration without foreign body, left lower leg, initial encounter after reviewing the patient's leg, I have recommended: 1. Stop application of hydrogen peroxide or any disinfectants. 2. apply 3 Band-Aids to the area and observe carefully for any drainage. 3. see him back for review next week The patient is quite convinced he has a foreign body like wooden splinters in his leg and wants me to explore this area which I have declined. He is still not convinced but he is agreeable with the treatment plan Plan Wound Cleansing: Cleanse wound with mild soap and water - No scrubbing Primary Wound Dressing: Wound #1 Left Lower Leg: Other: - self-adherent bandage (check daily for drainage) Follow-up Appointments: Wound #1 Left Lower Leg: Return Appointment in 1 week. after reviewing the patient's leg, I have recommended: 1. Stop application of hydrogen peroxide or any disinfectants. 2. apply 3 Band-Aids to the area and observe carefully for any drainage. 3. see him back for review next week The patient is quite convinced he has a foreign body like wooden splinters in his leg and wants me to explore this area which I have declined. He is still not convinced but he is agreeable with the treatment  plan Electronic Signature(s) Signed: 12/24/2015 3:33:52 PM By: Christin Fudge MD, FACS Previous Signature: 12/24/2015 3:33:33 PM Version By: Christin Fudge MD, FACS BRADLYN, UNDERWOOD ([id number]) Previous Signature: 12/24/2015 2:53:09 PM Version By: Christin Fudge MD, FACS Entered By: Christin Fudge on 12/24/2015 15:33:52 Standiford, Trudie Reed ([id number]) -------------------------------------------------------------------------------- ROS/PFSH Details Patient Name: Brady Hanna. Date of Service: 12/24/2015 1:30 PM Medical Record Number: [id number] Patient Account Number: [id number] Date of Birth/Sex: 05/18/1931 (82 y.o. Male) Treating RN: Cornell Barman Primary Care Physician: Frazier Richards Other Clinician: Referring Physician: Briscoe Deutscher Treating Physician/Extender: Frann Rider in Treatment: 0 Information Obtained From Patient Wound History Do you currently have one or more open woundso Yes How many open wounds do you currently haveo 3 Approximately how long have you had your woundso 4 months How have you been treating your wound(s) until nowo peroxide; abx ointment Has your wound(s) ever healed and then re-openedo Yes Have you had any lab work done in the past montho No Have you tested positive for an antibiotic resistant organism (MRSA, VRE)o No Have you tested positive for osteomyelitis (bone infection)o No Have you had any tests for circulation on your legso No Have you had other problems associated with your woundso Swelling Eyes Complaints and Symptoms: Positive for: Glasses / Contacts Negative for: Dry Eyes; Vision Changes Medical History: Positive for: Cataracts - removed Negative for:  Glaucoma; Optic Neuritis Integumentary (Skin) Complaints and Symptoms: Positive for: Wounds Negative for: Bleeding or bruising tendency; Breakdown; Swelling Medical History: Negative for: History of Burn; History of pressure wounds Constitutional Symptoms (General Health) Complaints  and Symptoms: No Complaints or Symptoms Ear/Nose/Mouth/Throat Complaints and Symptoms: No Complaints or Symptoms Face, KEISUKE ALDRED. ([id number]) Medical History: Negative for: Chronic sinus problems/congestion; Middle ear problems Hematologic/Lymphatic Complaints and Symptoms: No Complaints or Symptoms Medical History: Negative for: Anemia; Hemophilia; Human Immunodeficiency Virus; Lymphedema; Sickle Cell Disease Respiratory Complaints and Symptoms: No Complaints or Symptoms Medical History: Negative for: Aspiration; Asthma; Chronic Obstructive Pulmonary Disease (COPD); Pneumothorax; Sleep Apnea; Tuberculosis Cardiovascular Complaints and Symptoms: No Complaints or Symptoms Medical History: Positive for: Deep Vein Thrombosis - 1994 Negative for: Angina; Arrhythmia; Congestive Heart Failure; Coronary Artery Disease; Hypertension; Hypotension; Myocardial Infarction; Peripheral Arterial Disease; Peripheral Venous Disease; Phlebitis; Vasculitis Gastrointestinal Complaints and Symptoms: No Complaints or Symptoms Medical History: Negative for: Cirrhosis ; Colitis; Crohnos; Hepatitis A; Hepatitis B Endocrine Complaints and Symptoms: No Complaints or Symptoms Medical History: Negative for: Type I Diabetes; Type II Diabetes Genitourinary Complaints and Symptoms: No Complaints or Symptoms Gassman, ADIEL HORNG. ([id number]) Medical History: Negative for: End Stage Renal Disease Immunological Complaints and Symptoms: No Complaints or Symptoms Medical History: Negative for: Lupus Erythematosus; Raynaudos; Scleroderma Musculoskeletal Complaints and Symptoms: No Complaints or Symptoms Medical History: Negative for: Gout; Rheumatoid Arthritis; Osteoarthritis; Osteomyelitis Neurologic Complaints and Symptoms: No Complaints or Symptoms Medical History: Negative for: Dementia; Neuropathy; Quadriplegia; Paraplegia; Seizure Disorder Oncologic Complaints and Symptoms: No Complaints or  Symptoms Complaints and Symptoms: Review of System Notes: Prostate cancer 1994 Medical History: Negative for: Received Chemotherapy; Received Radiation Psychiatric Complaints and Symptoms: No Complaints or Symptoms Medical History: Negative for: Anorexia/bulimia; Confinement Anxiety HBO Extended History Items Eyes: Cataracts Pederson, SAMBHAV CARMENATE ([id number]) Family and Social History Cancer: No; Diabetes: No; Heart Disease: Yes - Mother; Hypertension: No; Kidney Disease: No; Lung Disease: No; Seizures: No; Stroke: No; Thyroid Problems: No; Never smoker; Marital Status - Married; Alcohol Use: Never; Drug Use: No History; Caffeine Use: Daily; Advanced Directives: No; Patient does not want information on Advanced Directives; Do not resuscitate: No; Living Will: No; Medical Power of Attorney: No Physician Affirmation I have reviewed and agree with the above information. Electronic Signature(s) Signed: 12/24/2015 3:30:17 PM By: Christin Fudge MD, FACS Signed: 12/24/2015 5:30:41 PM By: Gretta Cool RN, BSN, Kim RN, BSN Entered By: Christin Fudge on 12/24/2015 14:24:11 Tebbetts, Trudie Reed ([id number]) -------------------------------------------------------------------------------- [place] Details Patient Name: YOVANY, BRANDLE. Date of Service: 12/24/2015 Medical Record Number: [id number] Patient Account Number: [id number] Date of Birth/Sex: [birth year] (82 y.o. Male) Treating RN: Cornell Barman Primary Care Physician: Frazier Richards Other Clinician: Referring Physician: Briscoe Deutscher Treating Physician/Extender: Frann Rider in Treatment: 0 Diagnosis Coding ICD-10 Codes Code Description [phone] Laceration without foreign body, left lower leg, initial encounter Facility Procedures CPT4 Code: [id number] Description: [id number] - WOUND CARE VISIT-LEV 4 EST PT Modifier: Quantity: 1 Physician Procedures CPT4 Code Description: [id number] - WC PHYS LEVEL 4 - NEW PT ICD-10 Description Diagnosis  S81.812A Laceration without foreign body, left lower leg, ini Modifier: tial encounter Quantity: 1 Electronic Signature(s) Signed: 12/24/2015 3:30:17 PM By: Christin Fudge MD, FACS Signed: 12/24/2015 5:30:41 PM By: Gretta Cool RN, BSN, Kim RN, BSN Previous Signature: 12/24/2015 2:53:20 PM Version By: Christin Fudge MD, FACS Entered By: Gretta Cool, RN, BSN, Kim on 12/24/2015 15:10:22

## 2016-01-11 ENCOUNTER — Ambulatory Visit: Payer: No Typology Code available for payment source | Admitting: Surgery

## 2018-05-20 ENCOUNTER — Other Ambulatory Visit
Admission: RE | Admit: 2018-05-20 | Discharge: 2018-05-20 | Disposition: A | Discharge status: Home / Self Care | Payer: Medicare Other | Attending: Internal Medicine | Admitting: Internal Medicine

## 2018-05-20 ENCOUNTER — Encounter: Payer: Self-pay | Admitting: *Deleted

## 2018-05-20 ENCOUNTER — Emergency Department
Admission: EM | Admit: 2018-05-20 | Discharge: 2018-05-20 | Disposition: A | Discharge status: Home / Self Care | Payer: Medicare Other | Attending: Emergency Medicine | Admitting: Emergency Medicine

## 2018-05-20 ENCOUNTER — Other Ambulatory Visit: Payer: Self-pay

## 2018-05-20 ENCOUNTER — Emergency Department: Payer: Medicare Other

## 2018-05-20 DIAGNOSIS — R079 Chest pain, unspecified: Secondary | ICD-10-CM

## 2018-05-20 DIAGNOSIS — Z8546 Personal history of malignant neoplasm of prostate: Secondary | ICD-10-CM | POA: Insufficient documentation

## 2018-05-20 DIAGNOSIS — Z7982 Long term (current) use of aspirin: Secondary | ICD-10-CM | POA: Diagnosis not present

## 2018-05-20 DIAGNOSIS — R0789 Other chest pain: Secondary | ICD-10-CM | POA: Diagnosis present

## 2018-05-20 DIAGNOSIS — R9431 Abnormal electrocardiogram [ECG] [EKG]: Secondary | ICD-10-CM | POA: Insufficient documentation

## 2018-05-20 DIAGNOSIS — Z79899 Other long term (current) drug therapy: Secondary | ICD-10-CM | POA: Diagnosis not present

## 2018-05-20 DIAGNOSIS — Z85828 Personal history of other malignant neoplasm of skin: Secondary | ICD-10-CM | POA: Diagnosis not present

## 2018-05-20 LAB — CBC
HCT: 43.2 % (ref 39.0–52.0)
Hemoglobin: 14.9 g/dL (ref 13.0–17.0)
MCH: 29.4 pg (ref 26.0–34.0)
MCHC: 34.5 g/dL (ref 30.0–36.0)
MCV: 85.2 fL (ref 80.0–100.0)
Platelets: 254 10*3/uL (ref 150–400)
RBC: 5.07 MIL/uL (ref 4.22–5.81)
RDW: 13 % (ref 11.5–15.5)
WBC: 7.1 10*3/uL (ref 4.0–10.5)
nRBC: 0 % (ref 0.0–0.2)

## 2018-05-20 LAB — BASIC METABOLIC PANEL
Anion gap: 7 (ref 5–15)
BUN: 18 mg/dL (ref 8–23)
CO2: 24 mmol/L (ref 22–32)
Calcium: 9.4 mg/dL (ref 8.9–10.3)
Chloride: 107 mmol/L (ref 98–111)
Creatinine, Ser: 0.77 mg/dL (ref 0.61–1.24)
GFR calc Af Amer: >60 mL/min (ref >60)
GFR calc non Af Amer: >60 mL/min (ref >60)
Glucose, Bld: 123 mg/dL — ABNORMAL HIGH (ref 70–99)
Potassium: 3.5 mmol/L (ref 3.5–5.1)
Sodium: 138 mmol/L (ref 135–145)

## 2018-05-20 LAB — TROPONIN I
Troponin I: 0.03 ng/mL (ref <0.03)
Troponin I: <0.03 ng/mL (ref <0.03)

## 2018-05-20 LAB — FIBRIN DERIVATIVES D-DIMER (ARMC ONLY): Fibrin derivatives D-dimer (ARMC): 690.23 ng/mL (FEU) — ABNORMAL HIGH (ref 0.00–499.00)

## 2018-05-20 LAB — PROTIME-INR
INR: 1.1
Prothrombin Time: 14.1 seconds (ref 11.4–15.2)

## 2018-05-20 MED ORDER — IOHEXOL 350 MG/ML SOLN
75.0000 mL | Freq: Once | INTRAVENOUS | Status: AC | PRN
  Administered 2018-05-20: 75 mL via INTRAVENOUS

## 2018-05-20 NOTE — Discharge Instructions (Addendum)
Please seek medical attention for any high fevers, chest pain, shortness of breath, change in behavior, persistent vomiting, bloody stool or any other new or concerning symptoms.  

## 2018-05-20 NOTE — ED Provider Notes (Signed)
[facility] Emergency Department Provider Note  ____________________________________________   I have reviewed the triage vital signs and the nursing notes.   HISTORY  Chief Complaint Chest Pain   History limited by: Not Limited   HPI Brady Hanna is a 85 y.o. male who presents to the emergency department today because of concern for elevated d-dimer found at pcp office today. The patient went because of an episode of chest pain. Located in the left side of his chest. It occurred when the patient was sitting down. Described as a somewhat tight feeling. It was worse with deep breaths. The patient states that at the time of my exam it was no longer present. He also complained of a brief episode of left leg discomfort. He describes the feeling of warm fluid going down the back of his thigh. He also felt some numbness in his foot. The sensation is no longer present.    Per medical record review patient has a history of prostate cancer  Past Medical History:  Diagnosis Date  . Prostate cancer [facility])    prostate  . Skin cancer    ear    Patient Active Problem List   Diagnosis Date Noted  . Abnormal ECG 10/03/2013  . Chest pain 10/03/2013  . Breathlessness on exertion 10/03/2013  . Essential (primary) hypertension 10/03/2013  . Combined fat and carbohydrate induced hyperlipemia 10/03/2013    Past Surgical History:  Procedure Laterality Date  . APPENDECTOMY    . CATARACT EXTRACTION W/ INTRAOCULAR LENS IMPLANT Bilateral 2010 & 2012  . CHOLECYSTECTOMY    . CIRCUMCISION N/A 10/09/2014   Procedure: CIRCUMCISION ADULT;  Surgeon: Collier Flowers, MD;  Location: [facility] ORS;  Service: Urology;  Laterality: N/A;  . CYSTOSCOPY WITH DIRECT VISION INTERNAL URETHROTOMY N/A 10/09/2014   Procedure: CYSTOSCOPY WITH DIRECT VISION INTERNAL URETHROTOMY;  Surgeon: Collier Flowers, MD;  Location: [facility] ORS;  Service: Urology;  Laterality: N/A;  . PROSTATE SURGERY    .  TONSILLECTOMY      Prior to Admission medications   Medication Sig Start Date End Date Taking? Authorizing Provider  aspirin 325 MG tablet Take 325 mg by mouth every evening.    [provider]  fluticasone (FLONASE) 50 MCG/ACT nasal spray Place into the nose. Reported on 07/13/2015    [provider]  ibuprofen (ADVIL,MOTRIN) 200 MG tablet Take 200 mg by mouth every 6 (six) hours as needed for mild pain.    [provider]  mirabegron ER (MYRBETRIQ) 25 MG TB24 tablet Take 1 tablet (25 mg total) by mouth daily. 08/10/15   Nickie Retort, MD  pantoprazole (PROTONIX) 40 MG tablet  06/06/15   [provider]  sildenafil (VIAGRA) 100 MG tablet Take by mouth. Reported on 07/13/2015    [provider]    Allergies Codeine; Morphine and related; and Sulphadimidine [sulfamethazine]  Family History  Problem Relation Age of Onset  . Congestive Heart Failure Father   . Rectal cancer Sister   . Prostate cancer Brother     Social History Social History   Tobacco Use  . Smoking status: Never Smoker  . Smokeless tobacco: Never Used  Substance Use Topics  . Alcohol use: No  . Drug use: No    Review of Systems Constitutional: No fever/chills Eyes: No visual changes. ENT: No sore throat. Cardiovascular: Positive for chest pain. Respiratory: Denies shortness of breath. Gastrointestinal: No abdominal pain.  No nausea, no vomiting.  No diarrhea.   Genitourinary:  Negative for dysuria. Musculoskeletal: Positive for change in sensation to the left leg Skin: Negative for rash. Neurological: Positive for numbness to left foot  ____________________________________________   PHYSICAL EXAM:  VITAL SIGNS: ED Triage Vitals  Enc Vitals Group     BP 05/20/18 1813 (!) 133/58     Pulse Rate 05/20/18 1813 (!) 105     Resp 05/20/18 1813 16     Temp 05/20/18 1813 98.3 F (36.8 C)     Temp Source 05/20/18 1813 Oral     SpO2 05/20/18 1813 95 %      Weight 05/20/18 1814 225 lb (102.1 kg)     Height 05/20/18 1814 6' (1.829 m)     Head Circumference --      Peak Flow --      Pain Score 05/20/18 1814 3   Constitutional: Alert and oriented.  Eyes: Conjunctivae are normal.  ENT      Head: Normocephalic and atraumatic.      Nose: No congestion/rhinnorhea.      Mouth/Throat: Mucous membranes are moist.      Neck: No stridor. Hematological/Lymphatic/Immunilogical: No cervical lymphadenopathy. Cardiovascular: Normal rate, regular rhythm.  No murmurs, rubs, or gallops.  Respiratory: Normal respiratory effort without tachypnea nor retractions. Breath sounds are clear and equal bilaterally. No wheezes/rales/rhonchi. Gastrointestinal: Soft and non tender. No rebound. No guarding.  Genitourinary: Deferred Musculoskeletal: Normal range of motion in all extremities. No lower extremity edema. Neurologic:  Normal speech and language. No gross focal neurologic deficits are appreciated.  Skin:  Skin is warm, dry and intact. No rash noted. Psychiatric: Mood and affect are normal. Speech and behavior are normal. Patient exhibits appropriate insight and judgment.  ____________________________________________    LABS (pertinent positives/negatives)  Trop <0.03 BMP na 138, k 3.5, glu 123, cr 0.77 CBC wbc 7.1, hgb 14.9, plt 254 INR 1.10  ____________________________________________   EKG  I, Nance Pear, attending physician, personally viewed and interpreted this EKG  EKG Time: 1808 Rate: 93 Rhythm: normal sinus rhythm Axis: left axis deviation Intervals: qtc 484 QRS: LAFB, LVH ST changes: no st elevation Impression: abnormal ekg   ____________________________________________    RADIOLOGY  CT angio No PE, ascending aortic aneurysm measuring 4.1 cm  ____________________________________________   PROCEDURES  Procedures  ____________________________________________   INITIAL IMPRESSION / ASSESSMENT AND PLAN / ED  COURSE  Pertinent labs & imaging results that were available during my care of the patient were reviewed by me and considered in my medical decision making (see chart for details).   Patient presented to the emergency department today because of concerns for elevated d-dimer found at primary care doctor's office.  Patient d-dimer was minimally elevated however given patient's age would not be of significant concern.  Patient did have CT angios performed prior to my evaluation which did not show a blood clot.  Did not show any other concerning finding.  No pneumonia.  Patient's troponin again negative as it was during primary care doctor's office visit.  The patient is no longer feeling any concerning symptoms. At this point do think it is reasonable for patient to be discharged home. We did discuss return precautions.   ____________________________________________   FINAL CLINICAL IMPRESSION(S) / ED DIAGNOSES  Final diagnoses:  Nonspecific chest pain     Note: This dictation was prepared with Dragon dictation. Any transcriptional errors that result from this process are unintentional     Nance Pear, MD 05/21/18 1644

## 2018-05-20 NOTE — ED Triage Notes (Signed)
PT went to [place] [facility] for a tightness in his chest today. Pt had an EKG and chest Xray performed that were unremarkable but Troponin 0.03 and D-Dimer >600. No consistent chest pain and no noted SOB.

## 2018-06-03 DIAGNOSIS — R918 Other nonspecific abnormal finding of lung field: Secondary | ICD-10-CM | POA: Insufficient documentation

## 2018-06-03 DIAGNOSIS — I700 Atherosclerosis of aorta: Secondary | ICD-10-CM | POA: Insufficient documentation

## 2018-06-04 ENCOUNTER — Other Ambulatory Visit: Payer: Self-pay | Admitting: Internal Medicine

## 2018-06-04 ENCOUNTER — Other Ambulatory Visit (HOSPITAL_COMMUNITY): Payer: Self-pay | Admitting: Internal Medicine

## 2018-06-04 DIAGNOSIS — I208 Other forms of angina pectoris: Secondary | ICD-10-CM

## 2018-06-04 DIAGNOSIS — I700 Atherosclerosis of aorta: Secondary | ICD-10-CM

## 2018-06-04 DIAGNOSIS — R918 Other nonspecific abnormal finding of lung field: Secondary | ICD-10-CM

## 2018-06-28 ENCOUNTER — Ambulatory Visit: Payer: Medicare Other | Admitting: Urology

## 2018-08-23 ENCOUNTER — Ambulatory Visit: Payer: Medicare Other

## 2018-08-23 ENCOUNTER — Other Ambulatory Visit: Payer: Self-pay

## 2018-08-23 ENCOUNTER — Ambulatory Visit
Admission: RE | Admit: 2018-08-23 | Discharge: 2018-08-23 | Disposition: A | Discharge status: Home / Self Care | Payer: Medicare Other | Source: Ambulatory Visit | Attending: Internal Medicine | Admitting: Internal Medicine

## 2018-08-23 DIAGNOSIS — I700 Atherosclerosis of aorta: Secondary | ICD-10-CM | POA: Insufficient documentation

## 2018-08-23 DIAGNOSIS — R918 Other nonspecific abnormal finding of lung field: Secondary | ICD-10-CM | POA: Diagnosis present

## 2018-08-23 DIAGNOSIS — I208 Other forms of angina pectoris: Secondary | ICD-10-CM | POA: Diagnosis present

## 2018-12-24 ENCOUNTER — Encounter: Payer: Self-pay | Admitting: Emergency Medicine

## 2018-12-24 ENCOUNTER — Encounter
Admission: EM | Disposition: A | Discharge status: Home / Self Care | Payer: Self-pay | Source: Home / Self Care | Attending: Emergency Medicine

## 2018-12-24 ENCOUNTER — Emergency Department: Payer: Medicare Other | Admitting: Anesthesiology

## 2018-12-24 ENCOUNTER — Other Ambulatory Visit: Payer: Self-pay

## 2018-12-24 ENCOUNTER — Emergency Department
Admission: EM | Admit: 2018-12-24 | Discharge: 2018-12-24 | Disposition: A | Discharge status: Home / Self Care | Payer: Medicare Other | Attending: Emergency Medicine | Admitting: Emergency Medicine

## 2018-12-24 DIAGNOSIS — I10 Essential (primary) hypertension: Secondary | ICD-10-CM | POA: Diagnosis not present

## 2018-12-24 DIAGNOSIS — Z79899 Other long term (current) drug therapy: Secondary | ICD-10-CM | POA: Insufficient documentation

## 2018-12-24 DIAGNOSIS — Z7982 Long term (current) use of aspirin: Secondary | ICD-10-CM | POA: Insufficient documentation

## 2018-12-24 DIAGNOSIS — Z885 Allergy status to narcotic agent status: Secondary | ICD-10-CM | POA: Diagnosis not present

## 2018-12-24 DIAGNOSIS — R339 Retention of urine, unspecified: Secondary | ICD-10-CM | POA: Insufficient documentation

## 2018-12-24 DIAGNOSIS — Z791 Long term (current) use of non-steroidal anti-inflammatories (NSAID): Secondary | ICD-10-CM | POA: Insufficient documentation

## 2018-12-24 DIAGNOSIS — N320 Bladder-neck obstruction: Secondary | ICD-10-CM | POA: Diagnosis not present

## 2018-12-24 DIAGNOSIS — Z85828 Personal history of other malignant neoplasm of skin: Secondary | ICD-10-CM | POA: Diagnosis not present

## 2018-12-24 DIAGNOSIS — Z8546 Personal history of malignant neoplasm of prostate: Secondary | ICD-10-CM | POA: Diagnosis not present

## 2018-12-24 DIAGNOSIS — N35812 Other urethral bulbous stricture, male: Secondary | ICD-10-CM

## 2018-12-24 DIAGNOSIS — Z808 Family history of malignant neoplasm of other organs or systems: Secondary | ICD-10-CM | POA: Insufficient documentation

## 2018-12-24 DIAGNOSIS — N35919 Unspecified urethral stricture, male, unspecified site: Secondary | ICD-10-CM | POA: Diagnosis not present

## 2018-12-24 DIAGNOSIS — R338 Other retention of urine: Secondary | ICD-10-CM

## 2018-12-24 DIAGNOSIS — E782 Mixed hyperlipidemia: Secondary | ICD-10-CM | POA: Insufficient documentation

## 2018-12-24 DIAGNOSIS — Z8249 Family history of ischemic heart disease and other diseases of the circulatory system: Secondary | ICD-10-CM | POA: Diagnosis not present

## 2018-12-24 DIAGNOSIS — Z9049 Acquired absence of other specified parts of digestive tract: Secondary | ICD-10-CM | POA: Insufficient documentation

## 2018-12-24 DIAGNOSIS — Z20828 Contact with and (suspected) exposure to other viral communicable diseases: Secondary | ICD-10-CM | POA: Insufficient documentation

## 2018-12-24 DIAGNOSIS — Z882 Allergy status to sulfonamides status: Secondary | ICD-10-CM | POA: Diagnosis not present

## 2018-12-24 DIAGNOSIS — N99111 Postprocedural bulbous urethral stricture: Secondary | ICD-10-CM

## 2018-12-24 DIAGNOSIS — Z8042 Family history of malignant neoplasm of prostate: Secondary | ICD-10-CM | POA: Insufficient documentation

## 2018-12-24 HISTORY — PX: CYSTOSCOPY WITH URETHRAL DILATATION: SHX5125

## 2018-12-24 LAB — SARS CORONAVIRUS 2 BY RT PCR (HOSPITAL ORDER, PERFORMED IN ~~LOC~~ HOSPITAL LAB): SARS Coronavirus 2: NEGATIVE

## 2018-12-24 SURGERY — CYSTOSCOPY, WITH URETHRAL DILATION
Anesthesia: General

## 2018-12-24 MED ORDER — LACTATED RINGERS IV SOLN
INTRAVENOUS | Status: DC | PRN
  Administered 2018-12-24: 17:00:00 via INTRAVENOUS

## 2018-12-24 MED ORDER — FENTANYL CITRATE (PF) 100 MCG/2ML IJ SOLN
INTRAMUSCULAR | Status: DC | PRN
  Administered 2018-12-24 (×6): 25 ug via INTRAVENOUS

## 2018-12-24 MED ORDER — FENTANYL CITRATE (PF) 250 MCG/5ML IJ SOLN
INTRAMUSCULAR | Status: AC
  Filled 2018-12-24: qty 5

## 2018-12-24 MED ORDER — EPHEDRINE SULFATE 50 MG/ML IJ SOLN
INTRAMUSCULAR | Status: AC
  Filled 2018-12-24: qty 1

## 2018-12-24 MED ORDER — CIPROFLOXACIN IN D5W 400 MG/200ML IV SOLN
400.0000 mg | Freq: Once | INTRAVENOUS | Status: AC
  Administered 2018-12-24: 400 mg via INTRAVENOUS
  Filled 2018-12-24: qty 200

## 2018-12-24 MED ORDER — PROPOFOL 10 MG/ML IV BOLUS
INTRAVENOUS | Status: DC | PRN
  Administered 2018-12-24: 150 mg via INTRAVENOUS
  Administered 2018-12-24: 50 mg via INTRAVENOUS

## 2018-12-24 MED ORDER — CIPROFLOXACIN HCL 500 MG PO TABS: 500.0000 mg | ORAL_TABLET | Freq: Two times a day (BID) | ORAL | 0 refills | Status: AC

## 2018-12-24 MED ORDER — LIDOCAINE HCL (PF) 2 % IJ SOLN
INTRAMUSCULAR | Status: AC
  Filled 2018-12-24: qty 10

## 2018-12-24 MED ORDER — ONDANSETRON HCL 4 MG/2ML IJ SOLN
INTRAMUSCULAR | Status: AC
  Filled 2018-12-24: qty 2

## 2018-12-24 MED ORDER — FENTANYL CITRATE (PF) 100 MCG/2ML IJ SOLN
INTRAMUSCULAR | Status: AC
  Filled 2018-12-24: qty 2

## 2018-12-24 MED ORDER — HYDROCODONE-ACETAMINOPHEN 5-325 MG PO TABS
1.0000 | ORAL_TABLET | Freq: Once | ORAL | Status: DC
  Filled 2018-12-24: qty 1

## 2018-12-24 MED ORDER — PROPOFOL 500 MG/50ML IV EMUL
INTRAVENOUS | Status: AC
  Filled 2018-12-24: qty 50

## 2018-12-24 MED ORDER — PHENYLEPHRINE HCL (PRESSORS) 10 MG/ML IV SOLN
INTRAVENOUS | Status: DC | PRN
  Administered 2018-12-24: 200 ug via INTRAVENOUS
  Administered 2018-12-24: 100 ug via INTRAVENOUS

## 2018-12-24 MED ORDER — LIDOCAINE HCL (CARDIAC) PF 100 MG/5ML IV SOSY
PREFILLED_SYRINGE | INTRAVENOUS | Status: DC | PRN
  Administered 2018-12-24: 100 mg via INTRAVENOUS

## 2018-12-24 MED ORDER — ONDANSETRON HCL 4 MG/2ML IJ SOLN: 4.0000 mg | Freq: Once | INTRAMUSCULAR | Status: DC | PRN

## 2018-12-24 MED ORDER — ONDANSETRON HCL 4 MG/2ML IJ SOLN
INTRAMUSCULAR | Status: DC | PRN
  Administered 2018-12-24: 4 mg via INTRAVENOUS

## 2018-12-24 MED ORDER — FENTANYL CITRATE (PF) 100 MCG/2ML IJ SOLN: 25.0000 ug | INTRAMUSCULAR | Status: DC | PRN

## 2018-12-24 MED ORDER — SUCCINYLCHOLINE CHLORIDE 20 MG/ML IJ SOLN
INTRAMUSCULAR | Status: AC
  Filled 2018-12-24: qty 1

## 2018-12-24 MED ORDER — EPHEDRINE SULFATE 50 MG/ML IJ SOLN
INTRAMUSCULAR | Status: DC | PRN
  Administered 2018-12-24: 5 mg via INTRAVENOUS

## 2018-12-24 SURGICAL SUPPLY — 18 items
CATH FOL 2WAY LX 16X5 (CATHETERS) ×3 IMPLANT
CATH FOLEY 2W COUNCIL 20FR 5CC (CATHETERS) ×3 IMPLANT
CATH FOLEY 2W COUNCIL 5CC 16FR (CATHETERS) ×2 IMPLANT
CATH SET URETHRAL DILATOR (CATHETERS) ×3 IMPLANT
CATH URETL 5X70 OPEN END (CATHETERS) ×2 IMPLANT
CATH URETL OPEN END 6FR 70 (CATHETERS) ×2 IMPLANT
COVER WAND RF STERILE (DRAPES) ×3 IMPLANT
ELECT REM PT RETURN 9FT ADLT (ELECTROSURGICAL) ×3
ELECTRODE REM PT RTRN 9FT ADLT (ELECTROSURGICAL) ×1 IMPLANT
GLOVE BIO SURGEON STRL SZ8 (GLOVE) ×3 IMPLANT
GOWN STRL REUS W/ TWL XL LVL3 (GOWN DISPOSABLE) ×2 IMPLANT
GOWN STRL REUS W/TWL XL LVL3 (GOWN DISPOSABLE) ×4
GUIDEWIRE STR DUAL SENSOR (WIRE) ×2 IMPLANT
PACK CYSTO AR (MISCELLANEOUS) ×3 IMPLANT
SET CYSTO W/LG BORE CLAMP LF (SET/KITS/TRAYS/PACK) ×3 IMPLANT
SYR 30ML LL (SYRINGE) ×3 IMPLANT
WATER STERILE IRR 1000ML POUR (IV SOLUTION) ×3 IMPLANT
WATER STERILE IRR 3000ML UROMA (IV SOLUTION) ×3 IMPLANT

## 2018-12-24 NOTE — Anesthesia Procedure Notes (Signed)
Procedure Name: LMA Insertion Performed by: Hedda Slade, CRNA Pre-anesthesia Checklist: Patient identified, Patient being monitored, Timeout performed, Emergency Drugs available and Suction available Patient Re-evaluated:Patient Re-evaluated prior to induction Oxygen Delivery Method: Circle system utilized Preoxygenation: Pre-oxygenation with 100% oxygen Induction Type: IV induction Ventilation: Mask ventilation without difficulty LMA: LMA inserted LMA Size: 4.5 Tube type: Oral Number of attempts: 1 Placement Confirmation: positive ETCO2 and breath sounds checked- equal and bilateral Tube secured with: Tape Dental Injury: Teeth and Oropharynx as per pre-operative assessment

## 2018-12-24 NOTE — ED Triage Notes (Signed)
First Nurse Note:  Patient presents to the ED for urinary retention.  Patient states he hasn't been able to urinate for 6 hours and he "needs help".  Patient reports discomfort.

## 2018-12-24 NOTE — ED Triage Notes (Addendum)
See first nurse note. Pt c/o urinary retention. Hx of same with temporary catheter placement d/t prostate cancer. Pt appears uncomfortable.   Bladder scan 446mL.

## 2018-12-24 NOTE — ED Notes (Signed)
SARS order incorrect for a rapid SARS to be performed at [facility]. Sample was sent in tube matching the order. Lab contacted regarding no results. Lab explained that the SARS order was for [facility] sent-out and that the sample collected was in the appropriate tube for that. Recollecting SARs sample and lab is ordering appropriate order so that a rapid SAR test on-site can be performed.

## 2018-12-24 NOTE — ED Notes (Signed)
2nd collect of [place] swab obtained. OR will come to collect patient.

## 2018-12-24 NOTE — ED Notes (Signed)
Report to [facility] in [place]'

## 2018-12-24 NOTE — Discharge Instructions (Signed)
AMBULATORY SURGERY  DISCHARGE INSTRUCTIONS   1) The drugs that you were given will stay in your system until tomorrow so for the next 24 hours you should not:  A) Drive an automobile B) Make any legal decisions C) Drink any alcoholic beverage   2) You may resume regular meals tomorrow.  Today it is better to start with liquids and gradually work up to solid foods.  You may eat anything you prefer, but it is better to start with liquids, then soup and crackers, and gradually work up to solid foods.   3) Please notify your doctor immediately if you have any unusual bleeding, trouble breathing, redness and pain at the surgery site, drainage, fever, or pain not relieved by medication.    4) Additional Instructions:        Please contact your physician with any problems or Same Day Surgery at [phone], Monday through Friday 6 am to 4 pm, or [place] at [facility] number at [phone].AMBULATORY SURGERY  DISCHARGE INSTRUCTIONS   5) The drugs that you were given will stay in your system until tomorrow so for the next 24 hours you should not:  D) Drive an automobile E) Make any legal decisions F) Drink any alcoholic beverage   6) You may resume regular meals tomorrow.  Today it is better to start with liquids and gradually work up to solid foods.  You may eat anything you prefer, but it is better to start with liquids, then soup and crackers, and gradually work up to solid foods.   7) Please notify your doctor immediately if you have any unusual bleeding, trouble breathing, redness and pain at the surgery site, drainage, fever, or pain not relieved by medication.    8) Additional Instructions:M[place] will contact you with follow up        Please contact your physician with any problems or Same Day Surgery at [phone], Monday through Friday 6 am to 4 pm, or [place] at [facility] number at [phone].Cystoscopy patient instructions  Following a  cystoscopy, a catheter (a flexible rubber tube) is sometimes left in place to empty the bladder. This may cause some discomfort or a feeling that you need to urinate. Your doctor determines the period of time that the catheter will be left in place. You may have bloody urine for two to three days (Call your doctor if the amount of bleeding increases or does not subside).  You may pass blood clots in your urine, especially if you had a biopsy. It is not unusual to pass small blood clots and have some bloody urine a couple of weeks after your cystoscopy. Again, call your doctor if the bleeding does not subside. You may have: Dysuria (painful urination) Frequency (urinating often) Urgency (strong desire to urinate)  These symptoms are common especially if medicine is instilled into the bladder or a ureteral stent is placed. Avoiding alcohol and caffeine, such as coffee, tea, and chocolate, may help relieve these symptoms. Drink plenty of water, unless otherwise instructed. Your doctor may also prescribe an antibiotic or other medicine to reduce these symptoms.  Cystoscopy results are available soon after the procedure; biopsy results usually take two to four days. Your doctor will discuss the results of your exam with you. Before you go home, you will be given specific instructions for follow-up care. Special Instructions:  1  If you are going home with a catheter in place do not take a tub bath until removed by your doctor.  2  You may resume your normal activities.  3  Do not drive or operate machinery if you are taking narcotic pain medicine.  4  Be sure to keep all follow-up appointments with your doctor.   5 Call Your Doctor If: The catheter is not draining  You have severe pain  You are unable to urinate  You have a fever over 101  You have severe bleeding  [place] [phone]         6 an antibiotic prescription was sent to your pharmacy 7 you will be contacted by our  office on Monday for a follow-up appointment.

## 2018-12-24 NOTE — ED Notes (Signed)
This RN attempted to pass a 88F coude (was unable to find a large coude cath in ED). Would not pass into bladder. Pt is very irritated at staff that "a doctor isn't putting the catheter." Explained to pt that nurses and techs place catheters. Pt states "I've always had a doctor do it." pt states that he had his prostate removed over 20 years ago and that he has had 6 surgeries since, states that he has scar tissue build up in urethra.

## 2018-12-24 NOTE — ED Provider Notes (Signed)
[facility] Emergency Department Provider Note  ____________________________________________   First MD Initiated Contact with Patient 12/24/18 1138     (approximate)  I have reviewed the triage vital signs and the nursing notes.   HISTORY  Chief Complaint Urinary Retention    HPI Brady Hanna is a 85 y.o. male presents emergency department with urinary retention.  History of the same with catheter placement when he had prostate cancer.  He states he has not urinated since last night.  He is feeling very uncomfortable.  He did call the urologist but was unable to be seen as he had not been there 2 years.  He denies any fever chills.  Denies any other issues at this time.    Past Medical History:  Diagnosis Date  . Prostate cancer [facility])    prostate  . Skin cancer    ear    Patient Active Problem List   Diagnosis Date Noted  . Abnormal ECG 10/03/2013  . Chest pain 10/03/2013  . Breathlessness on exertion 10/03/2013  . Essential (primary) hypertension 10/03/2013  . Combined fat and carbohydrate induced hyperlipemia 10/03/2013    Past Surgical History:  Procedure Laterality Date  . APPENDECTOMY    . CATARACT EXTRACTION W/ INTRAOCULAR LENS IMPLANT Bilateral 2010 & 2012  . CHOLECYSTECTOMY    . CIRCUMCISION N/A 10/09/2014   Procedure: CIRCUMCISION ADULT;  Surgeon: Collier Flowers, MD;  Location: [facility] ORS;  Service: Urology;  Laterality: N/A;  . CYSTOSCOPY WITH DIRECT VISION INTERNAL URETHROTOMY N/A 10/09/2014   Procedure: CYSTOSCOPY WITH DIRECT VISION INTERNAL URETHROTOMY;  Surgeon: Collier Flowers, MD;  Location: [facility] ORS;  Service: Urology;  Laterality: N/A;  . PROSTATE SURGERY    . TONSILLECTOMY      Prior to Admission medications   Medication Sig Start Date End Date Taking? Authorizing Provider  aspirin 325 MG tablet Take 325 mg by mouth every evening.    [provider]  fluticasone (FLONASE) 50 MCG/ACT nasal spray Place into the  nose. Reported on 07/13/2015    [provider]  ibuprofen (ADVIL,MOTRIN) 200 MG tablet Take 200 mg by mouth every 6 (six) hours as needed for mild pain.    [provider]  mirabegron ER (MYRBETRIQ) 25 MG TB24 tablet Take 1 tablet (25 mg total) by mouth daily. 08/10/15   Nickie Retort, MD  pantoprazole (PROTONIX) 40 MG tablet  06/06/15   [provider]  sildenafil (VIAGRA) 100 MG tablet Take by mouth. Reported on 07/13/2015    [provider]    Allergies Codeine, Morphine and related, and Sulphadimidine [sulfamethazine]  Family History  Problem Relation Age of Onset  . Congestive Heart Failure Father   . Rectal cancer Sister   . Prostate cancer Brother     Social History Social History   Tobacco Use  . Smoking status: Never Smoker  . Smokeless tobacco: Never Used  Substance Use Topics  . Alcohol use: No  . Drug use: No    Review of Systems  Constitutional: No fever/chills Eyes: No visual changes. ENT: No sore throat. Respiratory: Denies cough Genitourinary: Negative for dysuria.  Positive urinary retention Musculoskeletal: Negative for back pain. Skin: Negative for rash.    ____________________________________________   PHYSICAL EXAM:  VITAL SIGNS: ED Triage Vitals  Enc Vitals Group     BP 12/24/18 1117 (!) 157/82     Pulse Rate 12/24/18 1117 70     Resp 12/24/18 1117 18  Temp 12/24/18 1117 97.8 F (36.6 C)     Temp Source 12/24/18 1117 Oral     SpO2 12/24/18 1117 96 %     Weight 12/24/18 1115 240 lb (108.9 kg)     Height 12/24/18 1115 6' (1.829 m)     Head Circumference --      Peak Flow --      Pain Score 12/24/18 1114 10     Pain Loc --      Pain Edu? --      Excl. in [place]? --     Constitutional: Alert and oriented. Well appearing and in no acute distress. Eyes: Conjunctivae are normal.  Head: Atraumatic. Nose: No congestion/rhinnorhea. Mouth/Throat: Mucous membranes are moist.   Neck:  supple no  lymphadenopathy noted Cardiovascular: Normal rate, regular rhythm.  Respiratory: Normal respiratory effort.  No retractions,  Abd: soft tender tender and protruding, Bs normal all 4 quad GU: deferred Musculoskeletal: FROM all extremities, warm and well perfused Neurologic:  Normal speech and language.  Skin:  Skin is warm, dry and intact. No rash noted. Psychiatric: Mood and affect are normal. Speech and behavior are normal.  ____________________________________________   LABS (all labs ordered are listed, but only abnormal results are displayed)  Labs Reviewed  SARS CORONAVIRUS 2  URINALYSIS, COMPLETE (UACMP) WITH MICROSCOPIC   ____________________________________________   ____________________________________________  RADIOLOGY    ____________________________________________   PROCEDURES  Procedure(s) performed: Foley catheter inserted, bladder scan showed 74 mL's   Procedures    ____________________________________________   INITIAL IMPRESSION / ASSESSMENT AND PLAN / ED COURSE  Pertinent labs & imaging results that were available during my care of the patient were reviewed by me and considered in my medical decision making (see chart for details).   Patient presents emergency department complaint of urinary retention.  Physical exam shows a abdomen to be distended.  Bladder scan showed 420 mL's of urine.  Foley catheter was attempted by nursing staff, coud catheter was attempted by nursing staff, discussed with Dr. Ellender Hose and he recommends calling urology.  DDX: urinary retention, scar tissue of urethra  Paged Dr. Bernardo Heater.  He is coming to the ED to see the patient.  Dr. Bernardo Heater and see the patient and states he will be taking the patient upstairs to the OR and will get a bed.  COVID test ordered    Brady Hanna was evaluated in Emergency Department on 12/24/2018 for the symptoms described in the history of present illness. He was evaluated in the  context of the global COVID-19 pandemic, which necessitated consideration that the patient might be at risk for infection with the SARS-CoV-2 virus that causes COVID-19. Institutional protocols and algorithms that pertain to the evaluation of patients at risk for COVID-19 are in a state of rapid change based on information released by regulatory bodies including the CDC and federal and state organizations. These policies and algorithms were followed during the patient's care in the ED.   As part of my medical decision making, I reviewed the following data within the [place] notes reviewed and incorporated, Old chart reviewed, A consult was requested and obtained from this/these consultant(s) Urology, Notes from prior ED visits and [place] Controlled Substance Database  ____________________________________________   FINAL CLINICAL IMPRESSION(S) / ED DIAGNOSES  Final diagnoses:  Acute urinary retention      NEW MEDICATIONS STARTED DURING THIS VISIT:  New Prescriptions   No medications on file     Note:  This document  was prepared using Systems analyst and may include unintentional dictation errors.    Versie Starks, PA-C 12/24/18 1427    Duffy Bruce, MD 12/24/18 2050

## 2018-12-24 NOTE — Consult Note (Signed)
Urology Consult  Chief Complaint: Unable to urinate  History of Present Illness: Brady Hanna is a 85 y.o. year old male seen in consultation at the request of Ashok Cordia, PA-C for evaluation of urinary retention and inability to place a Foley catheter.  He has a history of prostate cancer status post radical retropubic prostatectomy in 1994.  He had postoperative complications of urinary incontinence and recurrent urethral stricture.  He has had several internal urethrotomy's and catheterization over the years.  He had a several day history of progressive decreased force and caliber of his stream and presently is only urinating a very small amount with complaints of bladder fullness.  Estimated bladder volume by bladder scan was 420 mL.  Foley catheter placement by ED staff was unsuccessful.  He denies fever, chills or gross hematuria.  Past Medical History:  Diagnosis Date  . Prostate cancer [facility])    prostate  . Skin cancer    ear    Past Surgical History:  Procedure Laterality Date  . APPENDECTOMY    . CATARACT EXTRACTION W/ INTRAOCULAR LENS IMPLANT Bilateral 2010 & 2012  . CHOLECYSTECTOMY    . CIRCUMCISION N/A 10/09/2014   Procedure: CIRCUMCISION ADULT;  Surgeon: Collier Flowers, MD;  Location: [facility] ORS;  Service: Urology;  Laterality: N/A;  . CYSTOSCOPY WITH DIRECT VISION INTERNAL URETHROTOMY N/A 10/09/2014   Procedure: CYSTOSCOPY WITH DIRECT VISION INTERNAL URETHROTOMY;  Surgeon: Collier Flowers, MD;  Location: [facility] ORS;  Service: Urology;  Laterality: N/A;  . PROSTATE SURGERY    . TONSILLECTOMY      Home Medications:  No outpatient medications have been marked as taking for the 12/24/18 encounter [facility] Encounter).    Allergies:  Allergies  Allergen Reactions  . Codeine     "makes him agitated" per daughter  . Morphine And Related Itching  . Sulphadimidine [Sulfamethazine] Itching    Family History  Problem Relation Age of Onset  . Congestive Heart Failure  Father   . Rectal cancer Sister   . Prostate cancer Brother     Social History:  reports that he has never smoked. He has never used smokeless tobacco. He reports that he does not drink alcohol or use drugs.  ROS: A complete review of systems was performed.  All systems are negative except for pertinent findings as noted.  Physical Exam:  Vital signs in last 24 hours: Temp:  [97.8 F (36.6 C)] 97.8 F (36.6 C) (08/07 1117) Pulse Rate:  [70] 70 (08/07 1117) Resp:  [18] 18 (08/07 1117) BP: (157)/(82) 157/82 (08/07 1117) SpO2:  [96 %] 96 % (08/07 1117) Weight:  [108.9 kg] 108.9 kg (08/07 1115) Constitutional:  Alert and oriented, No acute distress HEENT: [place] AT, moist mucus membranes.  Trachea midline, no masses Cardiovascular: Regular rate and rhythm, no clubbing, cyanosis, or edema. Respiratory: Normal respiratory effort, lungs clear bilaterally GI: Abdomen is soft, nontender, nondistended, no abdominal masses GU: No CVA tenderness Skin: No rashes, bruises or suspicious lesions Lymph: No cervical or inguinal adenopathy Neurologic: Grossly intact, no focal deficits, moving all 4 extremities Psychiatric: Normal mood and affect  His external genitalia were prepped and draped.  Attempts at placing a 0.038 Glidewire into the bladder were not successful.  An attempt at cystoscopic placement of a catheter was not successful due to cystoscope malfunction.  Impression/Assessment:  Urinary retention secondary to recurrent urethral stricture  Plan:  To OR for cystoscopy under anesthesia with urethral dilation and catheter placement.  The procedure was discussed in detail including potential risks of bleeding, infection and inability to place catheter which would require percutaneous suprapubic tube.  He indicated all questions were answered and desires to proceed.  12/24/2018, 2:40 PM  John Giovanni,  MD

## 2018-12-24 NOTE — Anesthesia Post-op Follow-up Note (Signed)
Anesthesia QCDR form completed.        

## 2018-12-24 NOTE — Anesthesia Postprocedure Evaluation (Signed)
Anesthesia Post Note  Patient: Brady Hanna  Procedure(s) Performed: CYSTOSCOPY WITH URETHRAL DILATATION CATHETER PLACEMENT (N/A )  Patient location during evaluation: PACU Anesthesia Type: General Level of consciousness: awake and alert and oriented Pain management: pain level controlled Vital Signs Assessment: post-procedure vital signs reviewed and stable Respiratory status: spontaneous breathing Cardiovascular status: blood pressure returned to baseline Anesthetic complications: no     Last Vitals:  Vitals:   12/24/18 1847 12/24/18 1920  BP: 113/66 133/80  Pulse: 78 87  Resp: 20   Temp: (!) 36.1 C   SpO2: 94% 96%    Last Pain:  Vitals:   12/24/18 1847  TempSrc: Temporal  PainSc: 0-No pain                 Ceasia Elwell

## 2018-12-24 NOTE — ED Notes (Addendum)
Attempted to place Urethral catheter.  Unable to advance catheter

## 2018-12-24 NOTE — ED Notes (Signed)
Pt denied wanting to take pain medication d/t having to drive himself home. Pt states that at this time the pain has eased off d/t being able to urinate "some dribbles in that napkin I have in my underwear."

## 2018-12-24 NOTE — ED Notes (Signed)
Urology cart at bedside 

## 2018-12-24 NOTE — ED Notes (Signed)
Pt bladder scanned. Scan reads 210 mls

## 2018-12-24 NOTE — Anesthesia Preprocedure Evaluation (Signed)
Anesthesia Evaluation  Patient identified by MRN, date of birth, ID band Patient awake    Reviewed: Allergy & Precautions, H&P , NPO status , Patient's Chart, lab work & pertinent test results  Airway Mallampati: III  TM Distance: >3 FB     Dental  (+) Chipped, Missing   Pulmonary neg pulmonary ROS,           Cardiovascular hypertension,      Neuro/Psych negative neurological ROS  negative psych ROS   GI/Hepatic negative GI ROS, Neg liver ROS,   Endo/Other  negative endocrine ROS  Renal/GU negative Renal ROS     Musculoskeletal negative musculoskeletal ROS (+)   Abdominal   Peds  Hematology negative hematology ROS (+)   Anesthesia Other Findings Prostate cancer.  Reproductive/Obstetrics                             Anesthesia Physical  Anesthesia Plan  ASA: III  Anesthesia Plan: General   Post-op Pain Management:    Induction: Intravenous  PONV Risk Score and Plan:   Airway Management Planned: LMA  Additional Equipment:   Intra-op Plan:   Post-operative Plan: Extubation in OR  Informed Consent: I have reviewed the patients History and Physical, chart, labs and discussed the procedure including the risks, benefits and alternatives for the proposed anesthesia with the patient or authorized representative who has indicated his/her understanding and acceptance.       Plan Discussed with: CRNA  Anesthesia Plan Comments:         Anesthesia Quick Evaluation

## 2018-12-24 NOTE — ED Notes (Signed)
Urologist at bedside.

## 2018-12-24 NOTE — Op Note (Signed)
Preoperative diagnosis:  1. Urethral stricture, bulbar 2. Urinary retention  Postoperative diagnosis:  1. Urethral stricture, bulbar 2. Urinary retention  Procedure: 1. Cystoscopy with urethral dilation  Surgeon: Abbie Sons, MD  Anesthesia: General  Complications: None  Intraoperative findings:  - <5 French pinpoint stricture proximal bulbar urethra  EBL: Minimal  Specimens: None  Indication: Brady Hanna is a 85 y.o. patient with a history of recurrent urethral stricture after radical prostatectomy in 1994.  His last procedure was in 2016.  He presented to the ED today with a several day history of hesitancy and decreased force and caliber of his stream to the point of urinary retention.  He was seen in the ED where bladder volume was 420 mL by bladder scan.  Catheter placement by ED staff was unsuccessful.  Attempt at [facility] placement by me in the ED was not successful and cystoscopy could not be performed bedside due to equipment malfunction.  After reviewing the management options for treatment, he elected to proceed with the above surgical procedure(s). We have discussed the potential benefits and risks of the procedure, side effects of the proposed treatment, the likelihood of the patient achieving the goals of the procedure, and any potential problems that might occur during the procedure or recuperation. Informed consent has been obtained.  Description of procedure:  The patient was taken to the operating room and general anesthesia was induced.  The patient was placed in the dorsal lithotomy position, prepped and draped in the usual sterile fashion, and preoperative antibiotics were administered. A preoperative time-out was performed.   A 21 French cystoscope was lubricated and passed under direct vision.  A pinpoint stricture was noted in the proximal bulbar urethra as described above.  A 0.038 sensor wire was placed through the cystoscope and through the stricture  and advanced into the bladder.  An 8 French urethral dilator would not advance beyond the stricture.  6 and 5 French ureteral catheters would also not advance beyond the stricture.  A 4.5 French semirigid ureteroscope was then placed over the guidewire and advanced gently through the strictured area.  The guidewire was noted to be in the bladder.  The ureteroscope was withdrawn.  The 5 and 6 French ureteral catheters were able to be advanced over the wire as well as the 8 French dilating catheter.  The 10 French dilating catheter would not advanced through the strictured area.  A 12 French Heyman-Bard dilator was placed over the guidewire and gently advanced through the strictured area which was very dense.  Additional dilation was performed to 20 [place].  The 21 French cystoscope was then able to be advanced through the strictured area with minimal difficulty.  The bladder mucosa was closely inspected and there were no solid or papillary lesions.  There was moderate bladder trabeculation present.  The ureteral orifices were normal appearing bilaterally.  There was some erythema and telangiectasia of the posterior wall felt secondary to guidewire irritation.  A 16 French Councill catheter was then placed over the guidewire without difficulty and placed to gravity drainage.  After anesthetic reversal the patient was transported to the PACU in stable condition.   Abbie Sons, M.D.

## 2018-12-24 NOTE — ED Notes (Signed)
Called OR about patient's status. OR is waiting for covid results.

## 2018-12-24 NOTE — Transfer of Care (Signed)
Immediate Anesthesia Transfer of Care Note  Patient: Brady Hanna  Procedure(s) Performed: CYSTOSCOPY WITH URETHRAL DILATATION CATHETER PLACEMENT (N/A )  Patient Location: PACU  Anesthesia Type:General  Level of Consciousness: awake, alert  and oriented  Airway & Oxygen Therapy: Patient Spontanous Breathing and Patient connected to face mask oxygen  Post-op Assessment: Report given to RN and Post -op Vital signs reviewed and stable  Post vital signs: Reviewed and stable  Last Vitals:  Vitals Value Taken Time  BP 94/58 12/24/18 1755  Temp 35.9 C 12/24/18 1755  Pulse 77 12/24/18 1755  Resp 14 12/24/18 1755  SpO2 98 % 12/24/18 1755  Vitals shown include unvalidated device data.  Last Pain:  Vitals:   12/24/18 1755  TempSrc:   PainSc: 0-No pain         Complications: No apparent anesthesia complications

## 2018-12-25 ENCOUNTER — Encounter: Payer: Self-pay | Admitting: Urology

## 2018-12-27 ENCOUNTER — Telehealth: Payer: Self-pay | Admitting: Urology

## 2018-12-27 NOTE — Telephone Encounter (Signed)
I called patient to give him F/U appointment, patient states he is still having blood in urine and he was given Cipro 5 day supply on 12/24/18 and wants to know if he should get a refill?

## 2019-01-03 DIAGNOSIS — M1710 Unilateral primary osteoarthritis, unspecified knee: Secondary | ICD-10-CM | POA: Insufficient documentation

## 2019-01-03 DIAGNOSIS — Z0000 Encounter for general adult medical examination without abnormal findings: Secondary | ICD-10-CM | POA: Insufficient documentation

## 2019-01-03 DIAGNOSIS — M179 Osteoarthritis of knee, unspecified: Secondary | ICD-10-CM | POA: Insufficient documentation

## 2019-01-03 DIAGNOSIS — M1991 Primary osteoarthritis, unspecified site: Secondary | ICD-10-CM | POA: Insufficient documentation

## 2019-01-03 NOTE — Progress Notes (Signed)
01/04/2019 12:01 PM   Brady Hanna 1930-10-06 [id number]  Referring provider: Kirk Ruths, MD [place] [facility] [place],  [place] [address]  Chief Complaint  Patient presents with   Follow-up    HPI: [name] is an 85 year old male with a history of an urethral stricture who presents today for Foley removal and symptom recheck.  He was seen in the hospital on 12/24/2018 for evaluation of urinary retention and inability to place a Foley catheter.  He had a history of prostate cancer status post radical retropubic prostatectomy in 1994.  He had postoperative complications of urinary incontinence and recurrent urethral stricture.  He has had several internal urethrotomy's and catheterization over the years.  Estimated bladder volume by bladder scan was 420 mL.  Foley catheter placement by ED staff was unsuccessful.    He was taken to the OR on 12/24/2018 with Dr. Bernardo Heater.  Intraoperative findings positive for <5 [place] pinpoint stricture proximal bulbar urethra.  He underwent cystoscopy and dilation of the stricture.  Foley was placed to aid in drainage and hopefully prevent further stricturing in the future.    He presents today to have his catheter removed, but he would like to keep it in until September 1st.  He feels this will help prevent further strictures in the future.   PMH: Past Medical History:  Diagnosis Date   Prostate cancer [facility])    prostate   Skin cancer    ear    Surgical History: Past Surgical History:  Procedure Laterality Date   APPENDECTOMY     CATARACT EXTRACTION W/ INTRAOCULAR LENS IMPLANT Bilateral 2010 & 2012   CHOLECYSTECTOMY     CIRCUMCISION N/A 10/09/2014   Procedure: CIRCUMCISION ADULT;  Surgeon: Collier Flowers, MD;  Location: [facility] ORS;  Service: Urology;  Laterality: N/A;   CYSTOSCOPY WITH DIRECT VISION INTERNAL URETHROTOMY N/A 10/09/2014   Procedure: CYSTOSCOPY WITH DIRECT VISION INTERNAL URETHROTOMY;   Surgeon: Collier Flowers, MD;  Location: [facility] ORS;  Service: Urology;  Laterality: N/A;   CYSTOSCOPY WITH URETHRAL DILATATION N/A 12/24/2018   Procedure: CYSTOSCOPY WITH URETHRAL DILATATION CATHETER PLACEMENT;  Surgeon: Abbie Sons, MD;  Location: [facility] ORS;  Service: Urology;  Laterality: N/A;   PROSTATE SURGERY     TONSILLECTOMY      Home Medications:  Allergies as of 01/04/2019      Reactions   Codeine    "makes him agitated" per daughter   Morphine And Related Itching   Sulphadimidine [sulfamethazine] Itching      Medication List       Accurate as of January 04, 2019 12:01 PM. If you have any questions, ask your nurse or doctor.        aspirin EC 81 MG tablet Take 81 mg by mouth daily.   atorvastatin 20 MG tablet Commonly known as: LIPITOR Take 20 mg by mouth daily.   cetirizine 10 MG tablet Commonly known as: ZYRTEC Take 10 mg by mouth daily.   fluticasone 50 MCG/ACT nasal spray Commonly known as: FLONASE Place 1 spray into the nose daily. Reported on 07/13/2015   ibuprofen 200 MG tablet Commonly known as: ADVIL Take 200 mg by mouth every 6 (six) hours as needed for mild pain.   metoprolol succinate 50 MG 24 hr tablet Commonly known as: TOPROL-XL Take 50 mg by mouth daily.   mirabegron ER 25 MG Tb24 tablet Commonly known as: MYRBETRIQ Take 1 tablet (25 mg  total) by mouth daily.   pantoprazole 40 MG tablet Commonly known as: PROTONIX Take 40 mg by mouth daily.   sildenafil 100 MG tablet Commonly known as: VIAGRA Take 100 mg by mouth as needed for erectile dysfunction. Reported on 07/13/2015   traZODone 50 MG tablet Commonly known as: DESYREL Take 50 mg by mouth at bedtime.       Allergies:  Allergies  Allergen Reactions   Codeine     "makes him agitated" per daughter   Morphine And Related Itching   Sulphadimidine [Sulfamethazine] Itching    Family History: Family History  Problem Relation Age of Onset   Congestive Heart Failure  Father    Rectal cancer Sister    Prostate cancer Brother     Social History:  reports that he has never smoked. He has never used smokeless tobacco. He reports that he does not drink alcohol or use drugs.  ROS: UROLOGY Frequent Urination?: No Hard to postpone urination?: No Burning/pain with urination?: No Get up at night to urinate?: No Leakage of urine?: No Urine stream starts and stops?: No Trouble starting stream?: No Do you have to strain to urinate?: No Blood in urine?: No Urinary tract infection?: No Sexually transmitted disease?: No Injury to kidneys or bladder?: No Painful intercourse?: No Weak stream?: No Erection problems?: No Penile pain?: No  Gastrointestinal Nausea?: No Vomiting?: No Indigestion/heartburn?: No Diarrhea?: No Constipation?: No  Constitutional Fever: No Night sweats?: No Weight loss?: No Fatigue?: No  Skin Skin rash/lesions?: No Itching?: No  Eyes Blurred vision?: No Double vision?: No  Ears/Nose/Throat Sore throat?: No Sinus problems?: No  Hematologic/Lymphatic Swollen glands?: No Easy bruising?: No  Cardiovascular Leg swelling?: No Chest pain?: No  Respiratory Cough?: No Shortness of breath?: No  Endocrine Excessive thirst?: No  Musculoskeletal Back pain?: No Joint pain?: No  Neurological Headaches?: No Dizziness?: No  Psychologic Depression?: No Anxiety?: No  Physical Exam: BP 120/66 (BP Location: Left Arm, Patient Position: Sitting, Cuff Size: Large)    Pulse 78    Ht 6' (1.829 m)    Wt 248 lb 8 oz (112.7 kg)    BMI 33.70 kg/m   Constitutional:  Well nourished. Alert and oriented, No acute distress. HEENT: [place] AT, moist mucus membranes.  Trachea midline, no masses. Cardiovascular: No clubbing, cyanosis, or edema. Respiratory: Normal respiratory effort, no increased work of breathing. Neurologic: Grossly intact, no focal deficits, moving all 4 extremities. Psychiatric: Normal mood and  affect.  Laboratory Data: Lab Results  Component Value Date   WBC 7.1 05/20/2018   HGB 14.9 05/20/2018   HCT 43.2 05/20/2018   MCV 85.2 05/20/2018   PLT 254 05/20/2018    Lab Results  Component Value Date   CREATININE 0.77 05/20/2018    No results found for: PSA  No results found for: TESTOSTERONE  No results found for: HGBA1C  No results found for: TSH  No results found for: CHOL, HDL, CHOLHDL, VLDL, LDLCALC  No results found for: AST No results found for: ALT No components found for: ALKALINEPHOPHATASE No components found for: BILIRUBINTOTAL  No results found for: ESTRADIOL  Urinalysis No results found for: COLORURINE, APPEARANCEUR, LABSPEC, PHURINE, GLUCOSEU, HGBUR, BILIRUBINUR, KETONESUR, PROTEINUR, UROBILINOGEN, NITRITE, LEUKOCYTESUR  I have reviewed the labs.  Assessment & Plan:   1. S/P dilation of urethral stricture Discussed with the patient the increased risk for infection and sepsis the longer the Foley catheter remains in place.  I offered him instruction on CIC, that way he  may pass a catheter through once daily to help keep he urethral open. He was not interested in this option.  I also expressed my concerns regarding possible further infections and exposure to antibiotics increasing his risk for MDRO's.  He would not be moved and insisted that the Foley stay in until September 1st.  I stated this was against my recommendation.     Return for Please schedule Foley catheter removal in the am on September 1st .  These notes generated with voice recognition software. I apologize for typographical errors.  Zara Council, PA-C  [facility] [address]  [place], [place] [address] [phone]   I spent 25 minutes with this patient in a face to face visit of which greater than 50% was spent in counseling and coordination of care with the patient regarding his Foley catheter.

## 2019-01-04 ENCOUNTER — Ambulatory Visit (INDEPENDENT_AMBULATORY_CARE_PROVIDER_SITE_OTHER): Payer: Medicare Other | Admitting: Urology

## 2019-01-04 ENCOUNTER — Encounter: Payer: Self-pay | Admitting: Urology

## 2019-01-04 ENCOUNTER — Other Ambulatory Visit: Payer: Self-pay

## 2019-01-04 VITALS — BP 120/66 | HR 78 | Ht 72.0 in | Wt 248.5 lb

## 2019-01-04 DIAGNOSIS — I208 Other forms of angina pectoris: Secondary | ICD-10-CM

## 2019-01-04 DIAGNOSIS — Z9889 Other specified postprocedural states: Secondary | ICD-10-CM

## 2019-01-10 ENCOUNTER — Encounter: Payer: Self-pay | Admitting: Urology

## 2019-01-19 ENCOUNTER — Other Ambulatory Visit: Payer: Self-pay

## 2019-01-19 ENCOUNTER — Ambulatory Visit: Payer: Medicare Other

## 2019-01-19 DIAGNOSIS — Z9889 Other specified postprocedural states: Secondary | ICD-10-CM

## 2019-01-19 NOTE — Patient Instructions (Signed)

## 2019-01-19 NOTE — Progress Notes (Signed)
Catheter Removal  Patient is present today for a catheter removal.  63ml of water was drained from the balloon. A 16FR foley cath was removed from the bladder no complications were noted . Patient tolerated well.  Performed by: Gordy Clement, CMA   Follow up/ Additional notes: Pt very concerned about the possibility of going into retention again. Pt requests cic teaching and catheters. Per Shannon's last note, CIC teaching performed and pt given samples of 16 straight caths to use in the event is unable to void. Order for caths placed via [facility], will have order scanned in.

## 2019-01-21 ENCOUNTER — Telehealth: Payer: Self-pay | Admitting: Urology

## 2019-01-21 NOTE — Telephone Encounter (Signed)
Pt called and states that someone from a supply store called him and states that Dr Bernardo Heater had ordered him some catheter's. He states that he does not want any catheter's. He was given some at his appt on 01/19/2019.

## 2019-01-21 NOTE — Telephone Encounter (Signed)
Pt called to let us know he rec'd sample catheters [employer].  He will try these first to see if they work before having RX filled.

## 2019-01-21 NOTE — Telephone Encounter (Signed)
Spoke to patient and he does not want the catheters sent to him until he knows what he wants. [facility] was notified.

## 2019-01-27 ENCOUNTER — Telehealth: Payer: Self-pay | Admitting: Urology

## 2019-01-27 NOTE — Telephone Encounter (Signed)
Phalita with [facility] needs a return call to discuss catheter that was ordered. ASAP Ph# [phone]

## 2019-01-28 NOTE — Telephone Encounter (Signed)
Lm on Phalitas voicemail to return call.

## 2019-01-28 NOTE — Telephone Encounter (Signed)
Spoke to [employer] and she will hold the catheter order at this time. Per Mr. Weddel he does not want the order filled until he knows what he wants.

## 2019-03-15 ENCOUNTER — Other Ambulatory Visit: Payer: Self-pay

## 2019-03-15 ENCOUNTER — Emergency Department
Admission: EM | Admit: 2019-03-15 | Discharge: 2019-03-15 | Disposition: A | Discharge status: Home / Self Care | Payer: Medicare Other | Attending: Emergency Medicine | Admitting: Emergency Medicine

## 2019-03-15 ENCOUNTER — Telehealth: Payer: Self-pay | Admitting: Urology

## 2019-03-15 ENCOUNTER — Encounter: Payer: Self-pay | Admitting: Emergency Medicine

## 2019-03-15 DIAGNOSIS — R338 Other retention of urine: Secondary | ICD-10-CM

## 2019-03-15 DIAGNOSIS — R339 Retention of urine, unspecified: Secondary | ICD-10-CM | POA: Insufficient documentation

## 2019-03-15 DIAGNOSIS — I10 Essential (primary) hypertension: Secondary | ICD-10-CM | POA: Insufficient documentation

## 2019-03-15 DIAGNOSIS — Z79899 Other long term (current) drug therapy: Secondary | ICD-10-CM | POA: Diagnosis not present

## 2019-03-15 DIAGNOSIS — Z8546 Personal history of malignant neoplasm of prostate: Secondary | ICD-10-CM | POA: Diagnosis not present

## 2019-03-15 DIAGNOSIS — Z7982 Long term (current) use of aspirin: Secondary | ICD-10-CM | POA: Diagnosis not present

## 2019-03-15 DIAGNOSIS — Z85828 Personal history of other malignant neoplasm of skin: Secondary | ICD-10-CM | POA: Diagnosis not present

## 2019-03-15 HISTORY — DX: Crossing vessel and stricture of ureter without hydronephrosis: N13.5

## 2019-03-15 MED ORDER — LIDOCAINE HCL URETHRAL/MUCOSAL 2 % EX GEL
1.0000 "application " | Freq: Once | CUTANEOUS | Status: AC
  Administered 2019-03-15: 1 via URETHRAL
  Filled 2019-03-15: qty 10

## 2019-03-15 NOTE — Discharge Instructions (Signed)
As we discussed, the on-call urologist (Dr. Diamantina Providence) is willing and available to come in to the emergency department and place a Foley catheter since we in the emergency department (ED) were unable to do so successfully.  However, since you were able to pass some urine, you want to go home and follow-up in the urology clinic at 8:00 AM.  Please either go directly to the clinic so that you are there when they open or call the clinic first thing in the morning at 8:00 AM and explain you need to be seen as soon as possible.  Dr. Diamantina Providence also has your information and will try to coordinate with the schedulers so they know about you and will try to make sure you have the earliest available appointment.  If you develop worsening pain, inability to urinate, etc., please return immediately to the emergency department if you feel you are not able to wait until morning.

## 2019-03-15 NOTE — ED Provider Notes (Signed)
[facility] Emergency Department Provider Note  ____________________________________________   First MD Initiated Contact with Patient 03/15/19 0100     (approximate)  I have reviewed the triage vital signs and the nursing notes.   HISTORY  Chief Complaint Urinary Retention    HPI Brady Hanna is a 85 y.o. male with medical history  as listed below which notably includes recurrent urethral strictures requiring surgical dilatation by Dr. Bernardo Heater in the past.  He presents tonight for evaluation of acute onset urinary retention.  He says that he last urinated about 9:00 PM before he went to bed.  He woke up around midnight and was unable to urinate.  He has some supplies for self-catheterization which he has not had to do since having his indwelling Foley catheter removed at [facility] on September 1.  He said "things have been working fine" until [employer].  He has been unable to urinate and tried self cathing but it was unsuccessful.  He created a little bit of blood when he did so.  He came to the emergency department and they tried 1 attempts at catheterization in triage but was also unsuccessful.  He says that in the past they have "had to use the wire".  He reports moderate suprapubic pressure.  He denies contact with COVID-19 patients.  He denies sore throat, chest pain, shortness of breath, nausea, vomiting, and any other abdominal pain except for the suprapubic pressure.  He did not see any blood in the urine the last time he urinated and he thinks that the little bit of blood he saw her recently was from the attempt at self-catheterization.     Past Medical History:  Diagnosis Date   Prostate cancer [facility])    prostate   Skin cancer    ear   Ureteral stricture    recurrent, requiring in-dwelling Foleys and dilitation in the past    Patient Active Problem List   Diagnosis Date Noted   Localized, primary osteoarthritis  01/03/2019   Osteoarthritis of knee 01/03/2019   Healthcare maintenance 01/03/2019   Atherosclerosis of abdominal aorta ([place]) 06/03/2018   Lung mass 06/03/2018   Postprocedural male urethral stricture 01/09/2014   Abnormal ECG 10/03/2013   Chest pain 10/03/2013   Breathlessness on exertion 10/03/2013   Essential (primary) hypertension 10/03/2013   Combined fat and carbohydrate induced hyperlipemia 10/03/2013   Cyst of pancreas 10/18/2012   H/O prostate cancer 09/17/2011   History of kidney stones 09/17/2011   Incontinence of urine 09/17/2011   Varicose veins 09/17/2011    Past Surgical History:  Procedure Laterality Date   APPENDECTOMY     CATARACT EXTRACTION W/ INTRAOCULAR LENS IMPLANT Bilateral 2010 & 2012   CHOLECYSTECTOMY     CIRCUMCISION N/A 10/09/2014   Procedure: CIRCUMCISION ADULT;  Surgeon: Collier Flowers, MD;  Location: [facility] ORS;  Service: Urology;  Laterality: N/A;   CYSTOSCOPY WITH DIRECT VISION INTERNAL URETHROTOMY N/A 10/09/2014   Procedure: CYSTOSCOPY WITH DIRECT VISION INTERNAL URETHROTOMY;  Surgeon: Collier Flowers, MD;  Location: [facility] ORS;  Service: Urology;  Laterality: N/A;   CYSTOSCOPY WITH URETHRAL DILATATION N/A 12/24/2018   Procedure: CYSTOSCOPY WITH URETHRAL DILATATION CATHETER PLACEMENT;  Surgeon: Abbie Sons, MD;  Location: [facility] ORS;  Service: Urology;  Laterality: N/A;   PROSTATE SURGERY     TONSILLECTOMY      Prior to Admission medications   Medication Sig Start Date End Date Taking? Authorizing Provider  aspirin EC 81 MG tablet  Take 81 mg by mouth daily. 07/26/18 07/26/19  [provider]  atorvastatin (LIPITOR) 20 MG tablet Take 20 mg by mouth daily. 06/28/18 06/28/19  [provider]  cetirizine (ZYRTEC) 10 MG tablet Take 10 mg by mouth daily. 07/29/17   [provider]  fluticasone (FLONASE) 50 MCG/ACT nasal spray Place 1 spray into the nose daily. Reported on 07/13/2015    [provider]    ibuprofen (ADVIL,MOTRIN) 200 MG tablet Take 200 mg by mouth every 6 (six) hours as needed for mild pain.    [provider]  metoprolol succinate (TOPROL-XL) 50 MG 24 hr tablet Take 50 mg by mouth daily. 05/20/18 05/20/19  [provider]  mirabegron ER (MYRBETRIQ) 25 MG TB24 tablet Take 1 tablet (25 mg total) by mouth daily. Patient not taking: Reported on 12/24/2018 08/10/15   Nickie Retort, MD  pantoprazole (PROTONIX) 40 MG tablet Take 40 mg by mouth daily.  06/06/15   [provider]  sildenafil (VIAGRA) 100 MG tablet Take 100 mg by mouth as needed for erectile dysfunction. Reported on 07/13/2015    [provider]  traZODone (DESYREL) 50 MG tablet Take 50 mg by mouth at bedtime. 06/03/18 06/03/19  [provider]    Allergies Codeine, Morphine and related, and Sulphadimidine [sulfamethazine]  Family History  Problem Relation Age of Onset   Congestive Heart Failure Father    Rectal cancer Sister    Prostate cancer Brother     Social History Social History   Tobacco Use   Smoking status: Never Smoker   Smokeless tobacco: Never Used  Substance Use Topics   Alcohol use: No   Drug use: No    Review of Systems Constitutional: No fever/chills Eyes: No visual changes. ENT: No sore throat. Cardiovascular: Denies chest pain. Respiratory: Denies shortness of breath. Gastrointestinal: No abdominal pain.  No nausea, no vomiting.  No diarrhea.  No constipation. Genitourinary: Acute urinary retention. Musculoskeletal: Negative for neck pain.  Negative for back pain. Integumentary: Negative for rash. Neurological: Negative for headaches, focal weakness or numbness.   ____________________________________________   PHYSICAL EXAM:  ED Triage Vitals  Enc Vitals Group     BP 03/15/19 0217 (!) 141/78     Pulse Rate 03/15/19 0040 70     Resp 03/15/19 0040 17     Temp 03/15/19 0040 97.6 F (36.4 C)     Temp Source 03/15/19 0040 Oral      SpO2 03/15/19 0040 98 %     Weight --      Height --      Head Circumference --      Peak Flow --      Pain Score 03/15/19 0217 2     Pain Loc --      Pain Edu? --      Excl. in [place]? --      Constitutional: Alert and oriented.  Appears a little bit uncomfortable but is not in severe distress. Eyes: Conjunctivae are normal.  Head: Atraumatic. Nose: No congestion/rhinnorhea. Mouth/Throat: Patient is wearing a mask. Neck: No stridor.  No meningeal signs.   Cardiovascular: Normal rate, regular rhythm. Good peripheral circulation. Grossly normal heart sounds. Respiratory: Normal respiratory effort.  No retractions. Gastrointestinal: Soft and nontender. No distention.  GU: Normal external male genitalia.  No blood at the urethral meatus. Musculoskeletal: No lower extremity tenderness nor edema. No gross deformities of extremities. Neurologic:  Normal speech and language. No gross focal neurologic deficits are appreciated.  Skin:  Skin is warm, dry and intact. Psychiatric: Mood and affect are normal. Speech and behavior are normal.  ____________________________________________   LABS (all labs ordered are listed, but only abnormal results are displayed)  Labs Reviewed - No data to display ____________________________________________  EKG  None - EKG not ordered by ED physician ____________________________________________  RADIOLOGY I, Hinda Kehr, personally viewed and evaluated these images (plain radiographs) as part of my medical decision making, as well as reviewing the written report by the radiologist.  ED MD interpretation: No indication for emergent imaging  Official radiology report(s): No results found.  ____________________________________________   PROCEDURES   Procedure(s) performed (including Critical Care):  BLADDER CATHETERIZATION  Date/Time: 03/15/2019 7:43 AM Performed by: Hinda Kehr, MD Authorized by: Hinda Kehr, MD   Consent:     Consent obtained:  Verbal   Consent given by:  Patient   Risks discussed:  False passage, incomplete procedure, pain and urethral injury Pre-procedure details:    Procedure purpose:  Therapeutic   Preparation: Patient was prepped and draped in usual sterile fashion   Anesthesia (see MAR for exact dosages):    Anesthesia method:  Topical application   Topical anesthesia: UroJet. Procedure details:    Provider performed due to:  Altered anatomy and nurse unable to complete   Altered anatomy:  Urethral stricture   Catheter insertion:  Indwelling   Catheter type:  Coude   Catheter size:  14 Fr   Number of attempts:  3 Post-procedure details:    Patient tolerance of procedure:  Tolerated well, no immediate complications Comments:     Failed attempt, success to a point and then could no longer pass catheter into bladder.  No urine (nor blood) return.     ____________________________________________   INITIAL IMPRESSION / MDM / ASSESSMENT AND PLAN / ED COURSE  As part of my medical decision making, I reviewed the following data within the [place] notes reviewed and incorporated, Old chart reviewed, Discussed with urologist and reviewed Notes from prior ED visits   Differential diagnosis includes, but is not limited to, urinary retention secondary to stricture, acute hematuria leading to clots and retention, prostate issues, nephrolithiasis.  Given the patient's history, which I reviewed extensively in the medical record, I strongly suspect that he has once again developed a urethral stricture.  It is my hope that since it has been less than 2 months since his last indwelling Foley catheter which was reportedly a 40 [place], we will be able to pass a coud catheter tonight to allow him to urinate and follow-up with Dr. Bernardo Heater and [facility].  He is willing for [place] to give it a try.  I explained that we do not perform dilatation of the urethra  nor do we "pass a wire" as he is requesting and that if we are unsuccessful with catheterization attempts we will need to call urology.  He states that he understands.  No other concern at this time and no indication for blood work currently.      Clinical Course as of Mar 15 743  Tue Mar 15, 2019  0223 3 experience ED nurses were unable to pass either a 14 [place] nor 16 [place] coud Foley catheter.  I then attempted as well with a 14 [place] coud catheter and was also unable to pass the catheter into the bladder.  However, in the meantime, the patient has been able to urinate some spontaneously and he said that he feels  better than he did before.  After our unsuccessful attempt, I called and spoke with the on-call urologist, Dr. Diamantina Providence.  He said that he was available and could be here within about 45 minutes to perform what ever procedure was necessary to place a catheter including but not limited to dilatation.  I updated the patient about this but the patient says he just wants to go home.  He said that he feels better and will plan to go to the urology clinic at 8:00 AM.  I pointed out that the clinic does not open for almost 6 hours and I stressed to him that Dr. Diamantina Providence is ready and available to come in as soon as possible and take care of the situation for him, but the patient is insistent that he leave.  He has the capacity to make his own decision and he understands the situation as well as the possibility that he may not be able to pass urine at all for the next 6 hours although he pointed out again that he is leaking some urine.  He had about 200 mL in the bladder on bladder scan which is at least somewhat reassuring.  I updated Dr. Diamantina Providence and sent him the patient information through [facility] and he said that he would coordinate with the clinic in the morning to try to make sure they know to contact the patient as soon as they open and get him an appointment as soon as possible.  I gave the  patient my usual and customary return precautions and follow-up recommendations.  He knows that he either needs to call the clinic at 8 AM or show up and explained the situation and that Dr. Diamantina Providence is also trying to help coordinate care.   [CF]    Clinical Course User Index [CF] Hinda Kehr, MD     ____________________________________________  FINAL CLINICAL IMPRESSION(S) / ED DIAGNOSES  Final diagnoses:  Acute urinary retention     MEDICATIONS GIVEN DURING THIS VISIT:  Medications  lidocaine (XYLOCAINE) 2 % jelly 1 application (1 application Urethral Given 03/15/19 0151)     ED Discharge Orders    None      *Please note:  Brady Hanna was evaluated in Emergency Department on 03/15/2019 for the symptoms described in the history of present illness. He was evaluated in the context of the global COVID-19 pandemic, which necessitated consideration that the patient might be at risk for infection with the SARS-CoV-2 virus that causes COVID-19. Institutional protocols and algorithms that pertain to the evaluation of patients at risk for COVID-19 are in a state of rapid change based on information released by regulatory bodies including the CDC and federal and state organizations. These policies and algorithms were followed during the patient's care in the ED.  Some ED evaluations and interventions may be delayed as a result of limited staffing during the pandemic.*  Note:  This document was prepared using Dragon voice recognition software and may include unintentional dictation errors.   Hinda Kehr, MD 03/15/19 [phone]

## 2019-03-15 NOTE — Telephone Encounter (Signed)
Pt. Called at 3:35pm stating he needs to come in and see dr. Diamantina Providence because he went to ER last night and they told him to follow up this morning with Dr. Diamantina Providence and possibly get a Foley Catheter. Pt states he went to sleep and he didn't want to come in to office early because he was in pain from all the work the nurses in the ER did to him last night. Please advise.

## 2019-03-15 NOTE — ED Notes (Addendum)
Dr. Karma Greaser at the bedside with urologist on the phone to discuss plan of care and options for catheter placement.

## 2019-03-15 NOTE — ED Notes (Signed)
Bladder scan performed after pt states he voided a small amount in the toilet and his depends. 258ml of urine present at this time. Pt verbalizes understanding of need for catheter.

## 2019-03-15 NOTE — ED Notes (Signed)
Dr. Forbach at the bedside for pt evaluation  

## 2019-03-15 NOTE — ED Notes (Signed)
Bladder scanner shows 168 mL of urine in bladder.

## 2019-03-15 NOTE — ED Notes (Signed)
This RN attempted coude catheter 16 french with no success.

## 2019-03-15 NOTE — ED Notes (Addendum)
Pt states that he had a performed in August to open up his urethra, and he had his urinary catheter taken out in September. States that the doctor gave him in and out catheters to use if needed. States that he hasn't needed to use the catheters because he has been able to urinate; however, when he woke up today around 0000 and was unable to urinate so he came to the ED. States that he has drips of urine coming out but unable to completely urinate.

## 2019-03-15 NOTE — Telephone Encounter (Signed)
Spoke with patient he is currently not in pain and is able to urinate but it is a weak and slow stream. Per Dr. Bernardo Heater patient was offered surgery for Dilation, patient states he would like to try in office. Patient was scheduled for in office cysto- dilation on Thursday. He is to call if symptoms worsen

## 2019-03-15 NOTE — ED Notes (Signed)
Dr. Karma Greaser at the bedside after an unsuccessful attempt at catheterizing pt with coude 14 french catheter. Dr Karma Greaser assisted with catheter placement but was also unsuccessful. Provided for pt comfort and safety. Will continue to assess.

## 2019-03-17 ENCOUNTER — Other Ambulatory Visit: Payer: Self-pay

## 2019-03-17 ENCOUNTER — Ambulatory Visit (INDEPENDENT_AMBULATORY_CARE_PROVIDER_SITE_OTHER): Payer: Medicare Other | Admitting: Urology

## 2019-03-17 ENCOUNTER — Encounter: Payer: Self-pay | Admitting: Urology

## 2019-03-17 VITALS — BP 149/83 | HR 101 | Ht 72.0 in | Wt 248.0 lb

## 2019-03-17 DIAGNOSIS — Z9889 Other specified postprocedural states: Secondary | ICD-10-CM | POA: Diagnosis not present

## 2019-03-17 DIAGNOSIS — Z01812 Encounter for preprocedural laboratory examination: Secondary | ICD-10-CM

## 2019-03-17 LAB — URINALYSIS, COMPLETE
Bilirubin, UA: NEGATIVE
Glucose, UA: NEGATIVE
Ketones, UA: NEGATIVE
Nitrite, UA: POSITIVE — AB
Specific Gravity, UA: 1.025 (ref 1.005–1.030)
Urobilinogen, Ur: 0.2 mg/dL (ref 0.2–1.0)
pH, UA: 5 (ref 5.0–7.5)

## 2019-03-17 LAB — MICROSCOPIC EXAMINATION: WBC, UA: >30 /hpf — AB (ref 0–5)

## 2019-03-17 LAB — BLADDER SCAN AMB NON-IMAGING

## 2019-03-17 MED ORDER — CEFUROXIME AXETIL 500 MG PO TABS: 500.0000 mg | ORAL_TABLET | Freq: Two times a day (BID) | ORAL | 0 refills | Status: AC

## 2019-03-17 NOTE — Progress Notes (Signed)
Patient was scheduled for cystoscopy with dilation of urethral stricture and catheter placement.  He is voiding and PVR was 69 mL.  Urinalysis showed >30 WBCs and was nitrite positive.  It was elected to hold off on dilation until early next week.  Urine was cultured and antibiotic Rx was sent to his pharmacy pending the culture results.

## 2019-03-21 ENCOUNTER — Other Ambulatory Visit: Payer: Self-pay

## 2019-03-21 ENCOUNTER — Ambulatory Visit (INDEPENDENT_AMBULATORY_CARE_PROVIDER_SITE_OTHER): Payer: Medicare Other | Admitting: Urology

## 2019-03-21 ENCOUNTER — Encounter: Payer: Self-pay | Admitting: Urology

## 2019-03-21 VITALS — BP 146/79 | HR 96 | Ht 72.0 in | Wt 244.0 lb

## 2019-03-21 DIAGNOSIS — N35912 Unspecified bulbous urethral stricture, male: Secondary | ICD-10-CM | POA: Diagnosis not present

## 2019-03-21 NOTE — Progress Notes (Signed)
   03/21/19  CC:  Chief Complaint  Patient presents with  . Cysto    HPI: 85 y.o. old male with suspected recurrent urethral stricture.  He presently states he is voiding without problems.  He is scheduled for cystoscopy with possible dilation.  Urinalysis late last week showed pyuria and was nitrite positive.  He was started on antibiotics.  A culture was apparently not ordered.  Blood pressure (!) 146/79, pulse 96, height 6' (1.829 m), weight 244 lb (110.7 kg). NED. A&Ox3.   No respiratory distress   Abd soft, NT, ND Normal phallus with bilateral descended testicles  Cystoscopy Procedure Note  Patient identification was confirmed, informed consent was obtained, and patient was prepped using Betadine solution.  Lidocaine jelly was administered per urethral meatus.     Pre-Procedure: - Inspection reveals a normal caliber urethral meatus.  Procedure: The flexible cystoscope was introduced without difficulty  -A <6 French stricture was identified in the bulbar urethra.  A 0.038 guidewire would not advance.  Post-Procedure: - Patient tolerated the procedure well  Assessment/ Plan: - Recurrent urethral stricture -He is voiding without a significant residual.  Will schedule balloon dilation in OR. -He will instructed to call earlier for symptomatic retention.   Abbie Sons, MD

## 2019-03-22 ENCOUNTER — Other Ambulatory Visit
Admission: RE | Admit: 2019-03-22 | Discharge: 2019-03-22 | Disposition: A | Discharge status: Home / Self Care | Payer: Medicare Other | Source: Ambulatory Visit | Attending: Urology | Admitting: Urology

## 2019-03-22 DIAGNOSIS — Z01812 Encounter for preprocedural laboratory examination: Secondary | ICD-10-CM | POA: Insufficient documentation

## 2019-03-22 DIAGNOSIS — Z20828 Contact with and (suspected) exposure to other viral communicable diseases: Secondary | ICD-10-CM | POA: Diagnosis not present

## 2019-03-23 LAB — SARS CORONAVIRUS 2 (TAT 6-24 HRS): SARS Coronavirus 2: NEGATIVE

## 2019-03-23 MED ORDER — SODIUM CHLORIDE 0.9 % IV SOLN
1.0000 g | Freq: Once | INTRAVENOUS | Status: AC
  Administered 2019-03-24: 1 g via INTRAVENOUS
  Filled 2019-03-23: qty 1

## 2019-03-24 ENCOUNTER — Ambulatory Visit: Payer: Medicare Other | Admitting: Anesthesiology

## 2019-03-24 ENCOUNTER — Other Ambulatory Visit: Payer: Self-pay

## 2019-03-24 ENCOUNTER — Encounter
Admission: RE | Disposition: A | Discharge status: Home / Self Care | Payer: Self-pay | Source: Home / Self Care | Attending: Urology

## 2019-03-24 ENCOUNTER — Ambulatory Visit
Admission: RE | Admit: 2019-03-24 | Discharge: 2019-03-24 | Disposition: A | Discharge status: Home / Self Care | Payer: Medicare Other | Attending: Urology | Admitting: Urology

## 2019-03-24 ENCOUNTER — Encounter: Payer: Self-pay | Admitting: *Deleted

## 2019-03-24 DIAGNOSIS — I10 Essential (primary) hypertension: Secondary | ICD-10-CM | POA: Insufficient documentation

## 2019-03-24 DIAGNOSIS — Z85828 Personal history of other malignant neoplasm of skin: Secondary | ICD-10-CM | POA: Diagnosis not present

## 2019-03-24 DIAGNOSIS — N35812 Other urethral bulbous stricture, male: Secondary | ICD-10-CM | POA: Insufficient documentation

## 2019-03-24 DIAGNOSIS — Z9079 Acquired absence of other genital organ(s): Secondary | ICD-10-CM | POA: Diagnosis not present

## 2019-03-24 DIAGNOSIS — Z8546 Personal history of malignant neoplasm of prostate: Secondary | ICD-10-CM | POA: Diagnosis not present

## 2019-03-24 DIAGNOSIS — N99114 Postprocedural urethral stricture, male, unspecified: Secondary | ICD-10-CM | POA: Diagnosis not present

## 2019-03-24 DIAGNOSIS — Z7982 Long term (current) use of aspirin: Secondary | ICD-10-CM | POA: Diagnosis not present

## 2019-03-24 DIAGNOSIS — Z79899 Other long term (current) drug therapy: Secondary | ICD-10-CM | POA: Insufficient documentation

## 2019-03-24 HISTORY — PX: CYSTOSCOPY WITH URETHRAL DILATATION: SHX5125

## 2019-03-24 SURGERY — CYSTOSCOPY, WITH URETHRAL DILATION
Anesthesia: General | Site: Urethra

## 2019-03-24 MED ORDER — FAMOTIDINE 20 MG PO TABS
ORAL_TABLET | ORAL | Status: AC
  Administered 2019-03-24: 20 mg via ORAL
  Filled 2019-03-24: qty 1

## 2019-03-24 MED ORDER — PROPOFOL 10 MG/ML IV BOLUS
INTRAVENOUS | Status: AC
  Filled 2019-03-24: qty 20

## 2019-03-24 MED ORDER — FENTANYL CITRATE (PF) 100 MCG/2ML IJ SOLN
INTRAMUSCULAR | Status: DC | PRN
  Administered 2019-03-24 (×2): 25 ug via INTRAVENOUS

## 2019-03-24 MED ORDER — LIDOCAINE HCL URETHRAL/MUCOSAL 2 % EX GEL
CUTANEOUS | Status: AC
  Filled 2019-03-24: qty 5

## 2019-03-24 MED ORDER — LACTATED RINGERS IV SOLN: INTRAVENOUS | Status: DC

## 2019-03-24 MED ORDER — LACTATED RINGERS IV SOLN: INTRAVENOUS | Status: DC | PRN

## 2019-03-24 MED ORDER — PROPOFOL 10 MG/ML IV BOLUS
INTRAVENOUS | Status: DC | PRN
  Administered 2019-03-24: 130 mg via INTRAVENOUS
  Administered 2019-03-24: 30 mg via INTRAVENOUS

## 2019-03-24 MED ORDER — LIDOCAINE HCL (CARDIAC) PF 100 MG/5ML IV SOSY
PREFILLED_SYRINGE | INTRAVENOUS | Status: DC | PRN
  Administered 2019-03-24: 80 mg via INTRAVENOUS

## 2019-03-24 MED ORDER — ONDANSETRON HCL 4 MG/2ML IJ SOLN: 4.0000 mg | Freq: Once | INTRAMUSCULAR | Status: DC | PRN

## 2019-03-24 MED ORDER — FENTANYL CITRATE (PF) 100 MCG/2ML IJ SOLN
INTRAMUSCULAR | Status: AC
  Filled 2019-03-24: qty 2

## 2019-03-24 MED ORDER — ONDANSETRON HCL 4 MG/2ML IJ SOLN
INTRAMUSCULAR | Status: AC
  Filled 2019-03-24: qty 2

## 2019-03-24 MED ORDER — FAMOTIDINE 20 MG PO TABS
20.0000 mg | ORAL_TABLET | Freq: Once | ORAL | Status: AC
  Administered 2019-03-24: 07:00:00 20 mg via ORAL

## 2019-03-24 MED ORDER — FENTANYL CITRATE (PF) 100 MCG/2ML IJ SOLN: 25.0000 ug | INTRAMUSCULAR | Status: DC | PRN

## 2019-03-24 MED ORDER — SODIUM CHLORIDE 0.9 % IV SOLN
INTRAVENOUS | Status: DC
  Administered 2019-03-24 (×2): via INTRAVENOUS

## 2019-03-24 SURGICAL SUPPLY — 18 items
BAG URINE DRAIN 2000ML AR STRL (UROLOGICAL SUPPLIES) ×3 IMPLANT
CATH FOL 2WAY LX 16X5 (CATHETERS) ×1 IMPLANT
CATH FOLEY 2W COUNCIL 20FR 5CC (CATHETERS) IMPLANT
CATH FOLEY 2W COUNCIL 5CC 18FR (CATHETERS) ×2 IMPLANT
CATH SET URETHRAL DILATOR (CATHETERS) ×3 IMPLANT
ELECT REM PT RETURN 9FT ADLT (ELECTROSURGICAL) ×3
ELECTRODE REM PT RTRN 9FT ADLT (ELECTROSURGICAL) ×1 IMPLANT
GLOVE BIO SURGEON STRL SZ8 (GLOVE) ×3 IMPLANT
GOWN STRL REUS W/ TWL XL LVL3 (GOWN DISPOSABLE) ×2 IMPLANT
GOWN STRL REUS W/TWL XL LVL3 (GOWN DISPOSABLE) ×4
HOLDER FOLEY CATH W/STRAP (MISCELLANEOUS) ×3 IMPLANT
PACK CYSTO AR (MISCELLANEOUS) ×3 IMPLANT
SENSORWIRE 0.038 NOT ANGLED (WIRE) ×3
SET CYSTO W/LG BORE CLAMP LF (SET/KITS/TRAYS/PACK) ×3 IMPLANT
SYR 30ML LL (SYRINGE) ×3 IMPLANT
WATER STERILE IRR 1000ML POUR (IV SOLUTION) ×3 IMPLANT
WATER STERILE IRR 3000ML UROMA (IV SOLUTION) ×3 IMPLANT
WIRE SENSOR 0.038 NOT ANGLED (WIRE) ×1 IMPLANT

## 2019-03-24 NOTE — Anesthesia Procedure Notes (Signed)
Procedure Name: LMA Insertion Date/Time: 03/24/2019 7:45 AM Performed by: Allean Found, CRNA Pre-anesthesia Checklist: Patient identified, Patient being monitored, Timeout performed, Emergency Drugs available and Suction available Patient Re-evaluated:Patient Re-evaluated prior to induction Oxygen Delivery Method: Circle system utilized Preoxygenation: Pre-oxygenation with 100% oxygen Induction Type: IV induction Ventilation: Mask ventilation without difficulty LMA: LMA inserted LMA Size: 5.0 Tube type: Oral Number of attempts: 1 Placement Confirmation: positive ETCO2 and breath sounds checked- equal and bilateral Tube secured with: Tape Dental Injury: Teeth and Oropharynx as per pre-operative assessment

## 2019-03-24 NOTE — Anesthesia Post-op Follow-up Note (Signed)
Anesthesia QCDR form completed.        

## 2019-03-24 NOTE — H&P (Signed)
03/24/2019 6:57 AM   [name] 1930/07/09 [id number]  Referring provider: No referring provider defined for this encounter.   HPI: Brady Hanna is an 85 y.o. with a history of recurrent urethral stricture.  He is status post radical prostatectomy for prostate cancer in 1994.  He last underwent dilation of a bulbar urethral stricture with urinary retention 12/24/2018.  He was on intermittent catheterization however discontinued and presented to the ED last week with voiding difficulty.  His residual was less than 200 mL.  He was seen in the office on follow-up with a residual of approximately 70 mL.  He underwent an attempt at an office dilation earlier this week and a wire could not be placed through a recurrent bulbar stricture.  He presents for dilation under anesthesia.   PMH: Past Medical History:  Diagnosis Date  . Prostate cancer [facility])    prostate  . Skin cancer    ear  . Ureteral stricture    recurrent, requiring in-dwelling Foleys and dilitation in the past    Surgical History: Past Surgical History:  Procedure Laterality Date  . APPENDECTOMY    . CATARACT EXTRACTION W/ INTRAOCULAR LENS IMPLANT Bilateral 2010 & 2012  . CHOLECYSTECTOMY    . CIRCUMCISION N/A 10/09/2014   Procedure: CIRCUMCISION ADULT;  Surgeon: Collier Flowers, MD;  Location: [facility] ORS;  Service: Urology;  Laterality: N/A;  . CYSTOSCOPY WITH DIRECT VISION INTERNAL URETHROTOMY N/A 10/09/2014   Procedure: CYSTOSCOPY WITH DIRECT VISION INTERNAL URETHROTOMY;  Surgeon: Collier Flowers, MD;  Location: [facility] ORS;  Service: Urology;  Laterality: N/A;  . CYSTOSCOPY WITH URETHRAL DILATATION N/A 12/24/2018   Procedure: CYSTOSCOPY WITH URETHRAL DILATATION CATHETER PLACEMENT;  Surgeon: Abbie Sons, MD;  Location: [facility] ORS;  Service: Urology;  Laterality: N/A;  . PROSTATE SURGERY    . TONSILLECTOMY      Home Medications:  Prior to Admission medications   Medication Sig Start Date End Date Taking? Authorizing  Provider  aspirin EC 81 MG tablet Take 81 mg by mouth daily. 07/26/18 07/26/19  [provider]  atorvastatin (LIPITOR) 20 MG tablet Take 20 mg by mouth daily. 06/28/18 06/28/19  [provider]  cetirizine (ZYRTEC) 10 MG tablet Take 10 mg by mouth daily. 07/29/17   [provider]  fluticasone (FLONASE) 50 MCG/ACT nasal spray Place 1 spray into the nose daily. Reported on 07/13/2015    [provider]  ibuprofen (ADVIL,MOTRIN) 200 MG tablet Take 200 mg by mouth every 6 (six) hours as needed for mild pain.    [provider]  metoprolol succinate (TOPROL-XL) 50 MG 24 hr tablet Take 50 mg by mouth daily. 05/20/18 05/20/19  [provider]  mirabegron ER (MYRBETRIQ) 25 MG TB24 tablet Take 1 tablet (25 mg total) by mouth daily. Patient not taking: Reported on 12/24/2018 08/10/15   Nickie Retort, MD  pantoprazole (PROTONIX) 40 MG tablet Take 40 mg by mouth daily.  06/06/15   [provider]  sildenafil (VIAGRA) 100 MG tablet Take 100 mg by mouth as needed for erectile dysfunction. Reported on 07/13/2015    [provider]  traZODone (DESYREL) 50 MG tablet Take 50 mg by mouth at bedtime. 06/03/18 06/03/19  [provider]    Allergies:  Allergies  Allergen Reactions  . Codeine     "makes him agitated" per daughter  . Morphine And Related Itching  . Sulphadimidine [Sulfamethazine] Itching  . Trazodone And Nefazodone     Keeps pt awake  Family History: Family History  Problem Relation Age of Onset  . Congestive Heart Failure Father   . Rectal cancer Sister   . Prostate cancer Brother     Social History:  reports that he has never smoked. He has never used smokeless tobacco. He reports that he does not drink alcohol or use drugs.  ROS: Denies fever, chills, chest pain, shortness of breath  Physical Exam: BP (!) 154/66   Pulse 67   Temp 97.7 F (36.5 C) (Tympanic)   Resp 20   SpO2 99%    Constitutional:  Alert and oriented, No acute distress. HEENT: [employer] AT, moist mucus membranes.  Trachea midline, no masses. Cardiovascular: No clubbing, cyanosis, or edema.  RRR Respiratory: Normal respiratory effort, no increased work of breathing.  Clear GI: Abdomen is soft, nontender, nondistended, no abdominal masses GU: No CVA tenderness Lymph: No cervical or inguinal lymphadenopathy. Skin: No rashes, bruises or suspicious lesions. Neurologic: Grossly intact, no focal deficits, moving all 4 extremities. Psychiatric: Normal mood and affect.   Assessment & Plan:    - Recurrent bulbar urethral stricture Presents for balloon dilation under anesthesia.  The procedure was discussed in detail including potential risks of bleeding, infection/sepsis.  He was informed the likelihood of stricture recurrence and would recommend chronic intermittent catheterization and attempt to prevent stricture recurrence.  He indicated all questions were answered and desires to proceed.   Abbie Sons, [place]  [address] [place], [place] [address] [phone]

## 2019-03-24 NOTE — Anesthesia Preprocedure Evaluation (Signed)
Anesthesia Evaluation  Patient identified by MRN, date of birth, ID band Patient awake    Reviewed: Allergy & Precautions, H&P , NPO status , Patient's Chart, lab work & pertinent test results  Airway Mallampati: III  TM Distance: >3 FB     Dental  (+) Chipped, Missing   Pulmonary neg pulmonary ROS,           Cardiovascular hypertension,      Neuro/Psych negative neurological ROS  negative psych ROS   GI/Hepatic negative GI ROS, Neg liver ROS,   Endo/Other  negative endocrine ROS  Renal/GU negative Renal ROS     Musculoskeletal negative musculoskeletal ROS (+)   Abdominal   Peds  Hematology negative hematology ROS (+)   Anesthesia Other Findings Prostate cancer.  Reproductive/Obstetrics                             Anesthesia Physical  Anesthesia Plan  ASA: III  Anesthesia Plan: General   Post-op Pain Management:    Induction: Intravenous  PONV Risk Score and Plan:   Airway Management Planned: LMA  Additional Equipment:   Intra-op Plan:   Post-operative Plan: Extubation in OR  Informed Consent: I have reviewed the patients History and Physical, chart, labs and discussed the procedure including the risks, benefits and alternatives for the proposed anesthesia with the patient or authorized representative who has indicated his/her understanding and acceptance.       Plan Discussed with: CRNA  Anesthesia Plan Comments:         Anesthesia Quick Evaluation  

## 2019-03-24 NOTE — Discharge Instructions (Signed)
Her rest of of November 20 AMBULATORY SURGERY  DISCHARGE INSTRUCTIONS   1) The drugs that you were given will stay in your system until tomorrow so for the next 24 hours you should not:  A) Drive an automobile B) Make any legal decisions C) Drink any alcoholic beverage   2) You may resume regular meals tomorrow.  Today it is better to start with liquids and gradually work up to solid foods.  You may eat anything you prefer, but by you send a copy of the new referral Probably is GU: Examination of was probably just released COVID back number of urine was cultured your uterus will be in epic anyway he has been coughing thanks is better to start with liquids, then soup and crackers, and gradually work up to solid foods.   3) Please notify your doctor immediately if you have any unusual bleeding, trouble breathing, redness and pain at the surgery site, drainage, fever, or pain not relieved by medication.    4) Additional Instructions:        Please contact your physician at [phone] with any problems or Same Day Surgery at [phone], Monday through Friday 6 am to 4 pm, or [place] at [facility] number at [phone].

## 2019-03-24 NOTE — Interval H&P Note (Signed)
History and Physical Interval Note:  03/24/2019 7:21 AM  Brady Hanna  has presented today for surgery, with the diagnosis of urethral stricture.  The various methods of treatment have been discussed with the patient and family. After consideration of risks, benefits and other options for treatment, the patient has consented to  Procedure(s): CYSTOSCOPY WITH URETHRAL DILATATION (N/A) as a surgical intervention.  The patient's history has been reviewed, patient examined, no change in status, stable for surgery.  I have reviewed the patient's chart and labs.  Questions were answered to the patient's satisfaction.     [place]

## 2019-03-24 NOTE — Transfer of Care (Signed)
Immediate Anesthesia Transfer of Care Note  Patient: Brady Hanna  Procedure(s) Performed: CYSTOSCOPY WITH URETHRAL DILATATION (N/A Urethra)  Patient Location: PACU  Anesthesia Type:General  Level of Consciousness: sedated  Airway & Oxygen Therapy: Patient Spontanous Breathing and Patient connected to face mask oxygen  Post-op Assessment: Report given to RN and Post -op Vital signs reviewed and stable  Post vital signs: Reviewed and stable  Last Vitals:  Vitals Value Taken Time  BP 125/69 03/24/19 0822  Temp 36.1 C 03/24/19 0822  Pulse 59 03/24/19 0825  Resp 17 03/24/19 0824  SpO2 100 % 03/24/19 0825  Vitals shown include unvalidated device data.  Last Pain:  Vitals:   03/24/19 0617  TempSrc: Tympanic  PainSc: 0-No pain         Complications: No apparent anesthesia complications

## 2019-03-24 NOTE — Anesthesia Postprocedure Evaluation (Signed)
Anesthesia Post Note  Patient: Brady Hanna  Procedure(s) Performed: CYSTOSCOPY WITH URETHRAL DILATATION (N/A Urethra)  Patient location during evaluation: PACU Anesthesia Type: General Level of consciousness: awake and alert and oriented Pain management: pain level controlled Vital Signs Assessment: post-procedure vital signs reviewed and stable Respiratory status: spontaneous breathing Cardiovascular status: blood pressure returned to baseline Anesthetic complications: no     Last Vitals:  Vitals:   03/24/19 0904 03/24/19 1134  BP: (!) 155/65 (!) 123/57  Pulse: (!) 58 63  Resp: 18 16  Temp: (!) 36.2 C (!) 36.4 C  SpO2: 100% 99%    Last Pain:  Vitals:   03/24/19 1134  TempSrc:   PainSc: 0-No pain                 Modesta Sammons

## 2019-03-24 NOTE — Op Note (Signed)
Preoperative diagnosis:  1. Bulbar urethral stricture, recurrent  Postoperative diagnosis:  1. Same  Procedure: 1. Cystoscopy with dilation of urethral stricture  Surgeon: Abbie Sons, MD  Anesthesia: General  Complications: None  Intraoperative findings: <5 French bulbar urethral stricture  EBL: Minimal  Specimens: None  Indication: Brady Hanna is a 85 y.o. patient with recurrent urethral stricture who recently developed voiding difficulty.  PVR approximately 70 mL.  Office cystoscopy remarkable for recurrent stricture bulbous urethra and an attempt at an office dilation was not successful.  After reviewing the management options for treatment, he elected to proceed with the above surgical procedure(s). We have discussed the potential benefits and risks of the procedure, side effects of the proposed treatment, the likelihood of the patient achieving the goals of the procedure, and any potential problems that might occur during the procedure or recuperation. Informed consent has been obtained.  Description of procedure:  The patient was taken to the operating room and general anesthesia was induced.  The patient was placed in the dorsal lithotomy position, prepped and draped in the usual sterile fashion, and preoperative antibiotics were administered. A preoperative time-out was performed.   A 21 French cystoscope was lubricated and passed under direct vision.  The stricture was identified and the bulbous urethra.  A 0.038 sensor wire would not advance through the stricture.  The cystoscope was removed and a 4.5 French semirigid ureteroscope was passed per urethra and positioned at the urethral stricture and the sensor wire was easily advanced through the stricture into the bladder.  The stricture was then dilated from 8-20 [place] without difficulty.  The cystoscope was repassed.  No bleeding was noted at the dilated stricture site.  Panendoscopy was performed.  There was mild  bladder mucosal erythema with bladder trabeculation/cellule formation.  No bladder mucosal lesions were identified.  The cystoscope was removed.  An 32 French [place] catheter was placed over the wire without difficulty and placed to gravity drainage.  Plan: He will be discharged with an indwelling Foley catheter and follow-up next week for catheter removal. Due to his recurrent stricture I have recommended starting daily intermittent catheterization in an attempt to prevent stricture recurrence.  Abbie Sons, M.D.

## 2019-03-25 ENCOUNTER — Encounter: Payer: Self-pay | Admitting: Urology

## 2019-03-28 ENCOUNTER — Telehealth: Payer: Self-pay | Admitting: Urology

## 2019-03-28 NOTE — Telephone Encounter (Signed)
Per voicemail from post op Dr. Bernardo Heater wants this patient to come in to day and have his cath removed after having surgery last week. I called and left him a voicemail to call back to schedule on Sam or [place] today.   Sharyn Lull

## 2019-06-02 ENCOUNTER — Telehealth: Payer: Self-pay | Admitting: Urology

## 2019-06-02 NOTE — Telephone Encounter (Signed)
Attempted to reach Brady Hanna at [facility] 2 times. Left VM for her to return call.

## 2019-06-02 NOTE — Telephone Encounter (Signed)
Phalita with [facility] called asking for a call back about this patient. She has some questions about his follow up care. She asked for you?  [phone]

## 2019-06-15 ENCOUNTER — Telehealth: Payer: Self-pay | Admitting: Urology

## 2019-06-15 DIAGNOSIS — N35912 Unspecified bulbous urethral stricture, male: Secondary | ICD-10-CM

## 2019-06-15 NOTE — Telephone Encounter (Signed)
Attempted to contact patient and Voicemail has not been set up.

## 2019-06-15 NOTE — Telephone Encounter (Signed)
Pt called asking what the name of the provider that he was told to call to make an appt with at [facility]. States he was told his "condition" will be best suited seeing someone at [facility], he is asking what that name and phone number is.  Pt wasn't very descriptive, said a referral was supposed to be sent, but he hasn't heard from anyone, He's following up. Please advise. Thanks.

## 2019-06-15 NOTE — Telephone Encounter (Signed)
The only thing I see regarding his condition is he needs to start CIC to keep his urethra patent.  He was to come to the office for a Foley removal after his surgery in November.  Please confirm the Foley has been removed.  If he would like, we could refer to Dr. Francesca Jewett at [facility] for a second opinion.

## 2019-06-17 NOTE — Telephone Encounter (Signed)
Spoke to patient and he states he did have the catheter removed and he is wanting to go through with the referral for a second opinion on urethral stricture.

## 2019-07-06 ENCOUNTER — Other Ambulatory Visit: Payer: Self-pay | Admitting: Internal Medicine

## 2019-07-06 DIAGNOSIS — R399 Unspecified symptoms and signs involving the genitourinary system: Secondary | ICD-10-CM | POA: Diagnosis not present

## 2019-07-06 DIAGNOSIS — K862 Cyst of pancreas: Secondary | ICD-10-CM | POA: Diagnosis not present

## 2019-07-06 DIAGNOSIS — I700 Atherosclerosis of aorta: Secondary | ICD-10-CM | POA: Diagnosis not present

## 2019-07-06 DIAGNOSIS — I10 Essential (primary) hypertension: Secondary | ICD-10-CM | POA: Diagnosis not present

## 2019-07-06 DIAGNOSIS — E782 Mixed hyperlipidemia: Secondary | ICD-10-CM | POA: Diagnosis not present

## 2019-07-19 ENCOUNTER — Ambulatory Visit
Admission: RE | Admit: 2019-07-19 | Discharge: 2019-07-19 | Disposition: A | Discharge status: Home / Self Care | Payer: Medicare HMO | Source: Ambulatory Visit | Attending: Internal Medicine | Admitting: Internal Medicine

## 2019-07-19 ENCOUNTER — Other Ambulatory Visit: Payer: Self-pay

## 2019-07-19 DIAGNOSIS — K862 Cyst of pancreas: Secondary | ICD-10-CM

## 2019-07-19 DIAGNOSIS — K7689 Other specified diseases of liver: Secondary | ICD-10-CM | POA: Diagnosis not present

## 2019-07-19 MED ORDER — GADOBUTROL 1 MMOL/ML IV SOLN
10.0000 mL | Freq: Once | INTRAVENOUS | Status: AC | PRN
  Administered 2019-07-19: 13:00:00 10 mL via INTRAVENOUS

## 2019-07-21 ENCOUNTER — Other Ambulatory Visit: Payer: Self-pay | Admitting: Internal Medicine

## 2019-08-10 DIAGNOSIS — S52501A Unspecified fracture of the lower end of right radius, initial encounter for closed fracture: Secondary | ICD-10-CM | POA: Diagnosis not present

## 2019-08-19 DIAGNOSIS — M7541 Impingement syndrome of right shoulder: Secondary | ICD-10-CM | POA: Diagnosis not present

## 2019-08-19 DIAGNOSIS — S52531A Colles' fracture of right radius, initial encounter for closed fracture: Secondary | ICD-10-CM | POA: Diagnosis not present

## 2019-08-25 DIAGNOSIS — Z9079 Acquired absence of other genital organ(s): Secondary | ICD-10-CM | POA: Diagnosis not present

## 2019-08-25 DIAGNOSIS — N393 Stress incontinence (female) (male): Secondary | ICD-10-CM | POA: Diagnosis not present

## 2019-08-25 DIAGNOSIS — Z7982 Long term (current) use of aspirin: Secondary | ICD-10-CM | POA: Diagnosis not present

## 2019-08-25 DIAGNOSIS — Z79899 Other long term (current) drug therapy: Secondary | ICD-10-CM | POA: Diagnosis not present

## 2019-08-25 DIAGNOSIS — Z8546 Personal history of malignant neoplasm of prostate: Secondary | ICD-10-CM | POA: Diagnosis not present

## 2019-08-25 DIAGNOSIS — K862 Cyst of pancreas: Secondary | ICD-10-CM | POA: Diagnosis not present

## 2019-08-25 DIAGNOSIS — N35912 Unspecified bulbous urethral stricture, male: Secondary | ICD-10-CM | POA: Diagnosis not present

## 2019-08-25 DIAGNOSIS — I719 Aortic aneurysm of unspecified site, without rupture: Secondary | ICD-10-CM | POA: Diagnosis not present

## 2019-08-25 NOTE — H&P (View-Only) (Signed)
 ASSESSMENT:  Prostate cancer s/p RRP (1994). Reportedly NED. Urethral stenosis, likely VUA stricture, refractory to perineal reconstruction, repeated dilation, laser incision Severe stress urinary incontinence  DISCUSSION: Long discussion re: etiology, natural history and management of stress urinary incontinence and VUA stenoses. Urethra will need to be fully characterized. Will benefit from SP tube in short term (to help us  characterize stricture while avoiding an episode of retention) and this may be a great long-term solution for him as well (if his stricture recurs and his leakage improves - though this is not assured). He is interested in moving forward with SP tube  PLAN: Open SP tube Base on age and comorbidities, my preferences is for extended recovery though he would prefer to be discharged same day. We will plan on this but have a low threshold for overnight stay. Pre-care  Surgery Scheduling Checklist  Surgery (to be used for verify informed consent): Cystoscopy, open suprapubic tube Date of surgery: Next available Friday - OK for add on  Pre-op: No Pre-care: Yes Blood thinners: ASA (81) - do not stop Labwork: None Admission status: Outpatient Post-op appt: TBD Other pre-op needs: None Case length: 150 with turnover Priority level: Semi-elective  We discussed the additional risks related to undergoing a procedure during the COVID19 pandemic, including:     - The lack of information on the true risks during this pandemic.     - The uncertain (but likely increased) risk of nosocomial infection with COVID-19 given the hospital environment and the inability to social distance during a procedure.     - The possible impact of pandemic-associated resource shortages and changes in hospital operations on the postoperative care and experience.     - The possible complications from a COVID-19 infection.   The patient has had an opportunity to ask questions, and these questions  were answered to satisfaction. The patient expressed understanding.  Message sent to [facility] to post/schedule COVID test: Yes Case request ordered: Yes  ----  REFERRING PROVIDER: Clotilda Cornwall PA-C  HISTORY OF PRESENT ILLNESS:  A 86 y.o.-year-old male seen today in consultation at the request of Cornwall Clotilda Caldron, P* for urethral stricture.  He has a history of CaP s/p RRP (1994).  He is a difficult historian and is also hard of hearing. He is accompanied by his daughter. Nonetheless, it was difficult to obtain a good history. Most of the history is obtained from a review of referral notes and from [facility] notes.  Overall, sounds like had some stress incontinence after his prostatectomy but it got worse after treatment for anastomotic stricture. He doesn't recall an open surgery for VUA, but notes from [facility] suggest that this was performed by Dr. Toribio, likely perineal.   Most recently, Dr. Twylla performed cysto/dilation 03/17/19. Op note 115/20 reviewed. Findings:  <5-French bulbar urethral stricture Stricture dilated 8 to 20 Plan: daily intermittent cath Catheter removed in late December or early January He was unable to perform CIC.  PMH otherwise remarkable for 4cm AAA, appendectomy, cholecystectomy.  PVR today 41  Care Everywhere  Dr. Toribio [place] Cysto 12/24/13 Normal meatus, penile and distal bulbar urethra. Open membranous urethra with strong voluntary sphincter contraction. Fibrotic region in membranous urethra that narrows to a 8-83F lumen ( visible lumen beyond the stricture), unable to pass with flexible scope.  Plan: cysto with holmium laser incision of the stricture  02/28/14 op note Cystoscopy revealed normal meatus, penile urethra. Proximal bulbar urethral narrowed to a 3-44F lumen. Unable to pass a glide  wire. The most distal portion of the stricture was vaporized with the laser and forward pressure on the scope opened the lumen. The  stricture extended from the region of the membranous urethra to the BN. An elevated fibrotic BN then opened into the normal bladder lumen with the UO's approximately 2-3 cm cephalad to the BN. Bladder wall with 2+ trabeculation without mass.  [place] 10/25/08\ I saw Mr. Puryear today together with Dr. Jillyn who has completed the note. He is referred by Dr. Toribio with a complex prior history of bladder neck problems even having undergone a perineal approach bladder neck reconstruction by Dr. Toribio in the past. Unfortunately he continues to have recurrent obstruction and is recently status post need for dilation. He has profound incontinence which I think could only be managed, given this history, with an artificial sphincter. Because the cystoscopy today shows a very abnormal bulbar and membranous urethra, somewhat recently dilated and not negotiable with the endoscope even now, we elect to put management on hold and re-look at it endoscopically perhaps in November. I think our only hope would be for a more distally placed transcorporal cuff, particularly given the fact that his urethra will have been mobilized to do the anastomotic reconstruction.    Dr. Ree -03/27/1991 - underwent open prostatectomy, a few weeks later had issues with acute retention -PVR today 57cc. -Dilation x 2 since august 2020 -reports spontaneous urination without need for catheterization  -Usually gets UTIs after procedures -Wears pads and leaks all day, has leaked since his initial prostatectomy -Feels urge to urinary 10-12 x daily, wakes up at night x 4 to void  -Last had catheter in Dec 2020 x 20 daily after his procedure with Dr. Twylla -Plan was for self calibration with CIC, patient reports he couldn't do this, was afraid of infection risk and his manual dexterity is poor  -Never had biochemical recurrence after his prostatectomy for prostate cancer   -5 total cystoscopies in last 20 years  -patient lives alone,  apprehensive about large procedures/ aftercare  -Aortic aneurysm on ASA 81mg  (cardiologist Dr. Hester), pancreatic cyst     I review history elements and review of systems on new patient intake form.  IPSS 08/25/19: Incomplete emptying 5  Frequency 4  Intermittency 5  Urgency 3  Weak Stream 5  Straining 0  Nocturia  3  Total score:  25  QOL: 3    PAST MEDICAL HISTORY:  No past medical history on file.  PAST SURGICAL HISTORY:  No past surgical history on file.  MEDICATIONS:  Current Outpatient Medications  Medication Sig Dispense Refill  . aspirin (ECOTRIN) 81 MG tablet Take 81 mg by mouth.    SABRA atorvastatin (LIPITOR) 20 MG tablet TAKE 1 TABLET BY MOUTH ONCE DAILY    . cetirizine (ZYRTEC) 10 MG tablet Take 10 mg by mouth.    . cholecalciferol, vitamin D3-10 mcg, 400 unit,, 10 mcg (400 unit) cap Take 400 Units by mouth.    . metoprolol succinate (TOPROL-XL) 50 MG 24 hr tablet Take 50 mg by mouth.     No current facility-administered medications for this visit.     ALLERGIES:  Allergies  Allergen Reactions  . Codeine Confusion and Other (See Comments)    makes him agitated per daughter   . Morphine  (Bulk) Anaphylaxis  . Trazodone Other (See Comments)    Keeps pt awake   . Sulfa (Sulfonamide Antibiotics) Itching    FAMILY HISTORY:  No family history on file.  SOCIAL HISTORY:  Social History   Socioeconomic History  . Marital status: Unknown    Spouse name: None  . Number of children: None  . Years of education: None  . Highest education level: None  Occupational History  . None  Social Needs  . Financial resource strain: None  . Food insecurity    Worry: None    Inability: None  . Transportation needs    Medical: None    Non-medical: None  Tobacco Use  . Smoking status: Never Smoker  . Smokeless tobacco: Never Used  Substance and Sexual Activity  . Alcohol use: None  . Drug use: None  . Sexual activity: None  Lifestyle  . Physical activity     Days per week: None    Minutes per session: None  . Stress: None  Relationships  . Social Musician on phone: None    Gets together: None    Attends religious service: None    Active member of club or organization: None    Attends meetings of clubs or organizations: None    Relationship status: None  Other Topics Concern  . None  Social History Narrative  . None    REVIEW OF SYSTEMS:  10-system review of systems negative other than what is mentioned above. The patient was asked to review all abnormal responses not pertinent to today's visit with their primary care physician.  PHYSICAL EXAM:  GENERAL: Pleasant male in no acute distress.  VITAL SIGNS: Blood pressure 151/73, pulse 65, temperature 35.9 C, resp. rate 16, weight (!) 104.6 kg (230 lb 9.6 oz). HEENT: Normocephalic, atraumatic, extraocular muscles intact NECK: Supple, no lymphadenopathy CARDIOVASCULAR: No peripheral edema PULMONARY: Normal work of breathing, no use of accessory muscles ABDOMEN: Soft, non-tender, non-distended. No organomegaly or hernias. BACK: No costovertebral angle tenderness, no spiny bone tenderness.  EXTREMITIES: No clubbing, cyanosis or edema.  NEUROLOGIC:  Cranial nerves II-XII grossly intact PSYCHOLOGIC: Normal affect, normal mood SKIN: Warm and dry. No lesions. GU: circ phallus, +severe sui with valsalva, nl test/epid bilaterally  LAB RESULTS:  Results for orders placed or performed in visit on 08/25/19  POCT Urinalysis Dipstick  Result Value Ref Range   Spec Gravity/POC >=1.030 1.003 - 1.030   PH/POC 5.0 5.0 - 9.0   Leuk Esterase/POC Negative Negative   Nitrite/POC Negative Negative   Protein/POC Negative Negative   UA Glucose/POC Negative Negative   Ketones, POC Negative Negative   Bilirubin/POC Negative Negative   Blood/POC Negative Negative   Urobilinogen/POC 1.0 0.2 - 1.0 mg/dL   Ordered at this visit:  Orders Placed This Encounter  Procedures  . Measure post  void residual    No results found for: PSASCRN, PSADIAG  No results found for: WBC, HGB, HCT, PLT  No results found for: NA, K, CL, CO2, BUN, CREATININE, GLU, CALCIUM, MG, PHOS  No results found for: BILITOT, BILIDIR, PROT, ALBUMIN, ALT, AST, ALKPHOS, GGT  No results found for: LABPROT, INR, APTT   IPSS: Incomplete emptying 5  Frequency 4  Intermittency 5  Urgency 3  Weak Stream 5  Straining 0  Nocturia  3  Total score:  25  QOL: 3

## 2019-08-26 DIAGNOSIS — M13811 Other specified arthritis, right shoulder: Secondary | ICD-10-CM | POA: Diagnosis not present

## 2019-08-26 DIAGNOSIS — M25511 Pain in right shoulder: Secondary | ICD-10-CM | POA: Diagnosis not present

## 2019-08-26 DIAGNOSIS — M25611 Stiffness of right shoulder, not elsewhere classified: Secondary | ICD-10-CM | POA: Diagnosis not present

## 2019-08-30 DIAGNOSIS — M25511 Pain in right shoulder: Secondary | ICD-10-CM | POA: Diagnosis not present

## 2019-08-30 DIAGNOSIS — M25611 Stiffness of right shoulder, not elsewhere classified: Secondary | ICD-10-CM | POA: Diagnosis not present

## 2019-09-01 DIAGNOSIS — M25511 Pain in right shoulder: Secondary | ICD-10-CM | POA: Diagnosis not present

## 2019-09-01 DIAGNOSIS — M25611 Stiffness of right shoulder, not elsewhere classified: Secondary | ICD-10-CM | POA: Diagnosis not present

## 2019-09-05 DIAGNOSIS — M7541 Impingement syndrome of right shoulder: Secondary | ICD-10-CM | POA: Diagnosis not present

## 2019-09-06 DIAGNOSIS — M25511 Pain in right shoulder: Secondary | ICD-10-CM | POA: Diagnosis not present

## 2019-09-06 DIAGNOSIS — M25611 Stiffness of right shoulder, not elsewhere classified: Secondary | ICD-10-CM | POA: Diagnosis not present

## 2019-09-08 DIAGNOSIS — M25611 Stiffness of right shoulder, not elsewhere classified: Secondary | ICD-10-CM | POA: Diagnosis not present

## 2019-09-08 DIAGNOSIS — M25511 Pain in right shoulder: Secondary | ICD-10-CM | POA: Diagnosis not present

## 2019-09-13 DIAGNOSIS — M25511 Pain in right shoulder: Secondary | ICD-10-CM | POA: Diagnosis not present

## 2019-09-13 DIAGNOSIS — M25611 Stiffness of right shoulder, not elsewhere classified: Secondary | ICD-10-CM | POA: Diagnosis not present

## 2019-09-15 DIAGNOSIS — M25511 Pain in right shoulder: Secondary | ICD-10-CM | POA: Diagnosis not present

## 2019-09-15 DIAGNOSIS — M25611 Stiffness of right shoulder, not elsewhere classified: Secondary | ICD-10-CM | POA: Diagnosis not present

## 2019-09-19 DIAGNOSIS — M1991 Primary osteoarthritis, unspecified site: Secondary | ICD-10-CM | POA: Diagnosis not present

## 2019-09-19 DIAGNOSIS — S52531A Colles' fracture of right radius, initial encounter for closed fracture: Secondary | ICD-10-CM | POA: Diagnosis not present

## 2019-09-21 DIAGNOSIS — Z01812 Encounter for preprocedural laboratory examination: Secondary | ICD-10-CM | POA: Diagnosis not present

## 2019-09-21 DIAGNOSIS — Z20822 Contact with and (suspected) exposure to covid-19: Secondary | ICD-10-CM | POA: Diagnosis not present

## 2019-09-23 DIAGNOSIS — H9190 Unspecified hearing loss, unspecified ear: Secondary | ICD-10-CM | POA: Diagnosis not present

## 2019-09-23 DIAGNOSIS — Z8546 Personal history of malignant neoplasm of prostate: Secondary | ICD-10-CM | POA: Diagnosis not present

## 2019-09-23 DIAGNOSIS — Z9049 Acquired absence of other specified parts of digestive tract: Secondary | ICD-10-CM | POA: Diagnosis not present

## 2019-09-23 DIAGNOSIS — N393 Stress incontinence (female) (male): Secondary | ICD-10-CM | POA: Diagnosis not present

## 2019-09-23 DIAGNOSIS — Z882 Allergy status to sulfonamides status: Secondary | ICD-10-CM | POA: Diagnosis not present

## 2019-09-23 DIAGNOSIS — N35812 Other urethral bulbous stricture, male: Secondary | ICD-10-CM | POA: Diagnosis not present

## 2019-09-23 DIAGNOSIS — N35912 Unspecified bulbous urethral stricture, male: Secondary | ICD-10-CM | POA: Diagnosis not present

## 2019-09-23 DIAGNOSIS — Z885 Allergy status to narcotic agent status: Secondary | ICD-10-CM | POA: Diagnosis not present

## 2019-09-23 DIAGNOSIS — N35919 Unspecified urethral stricture, male, unspecified site: Secondary | ICD-10-CM | POA: Diagnosis not present

## 2019-09-23 NOTE — Unmapped External Note (Signed)
 PREOPERATIVE DIAGNOSIS Prostate cancer s/p RRP (1994). Reportedly NED. Urethral stenosis, likely VUA stricture, refractory to perineal reconstruction, repeated dilation, laser incision Severe stress urinary incontinence  POSTOPERATIVE DIAGNOSIS Prostate cancer s/p RRP (1994). Reportedly NED. Urethral stenosis, likely VUA stricture, refractory to perineal reconstruction, repeated dilation, laser incision Severe stress urinary incontinence  PROCEDURES PERFORMED Open suprapubic tube placement (48959) Antegrade cystoscopy (52000)  SURGEONS Surgeon(s) and Role:    * Adine Alm Kays, MD - Primary    * Leafy Savin, MD - Resident - Assisting    * Norleen Kristine Ruth, MD - Fellow - Interventional   ANESTHESIA Anesthesiologist: Dorthy Stephania Pride, MD Anesthesia Provider: Adine Lemond Kerns, CRNA  SPECIMENS * No orders in the log *  IMPLANTS * No implants in log *  DRAINS 18-French suprapubic tube  ESTIMATED BLOOD LOSS 5mL  COMPLICATIONS None  CONDITION Stable  FINDINGS Retrograde cystoscopy: completely obliterated proximal bulb Antegrade cystoscopy: pinpoint bladder neck Unable to traverse stricture with glidewire 18-French open suprapubic tube placed without difficulty  INDICATIONS FOR SURGERY  Brady Hanna is a 86 y.o. male. After extensive discussion of risks, benefits and alternatives the patient wished to proceed.  OPERATIVE TECHNIQUE   Informed consent was obtained. The patient's identity was confirmed in the pre-operative holding area, and the patient was brought back to the operating room by anesthesia then sedated and intubated. Peri-operative IV antibiotics were administered prior to surgery start.  The patient was positioned supine. Lower abdomen and genitals were clipped, prepped and draped in the standard sterile fashion.  Glidewire and 5-French ureteral catheter were inserted per urethra under cystoscopic guidance. We were unable to traverse the  stricture as it was completely obliterated.  An approximately 4cm low midline incision was made. Dissection was carried down to the pubic symphysis. Anterior rectus fascia was opened. Rectus bellies were lateralized. Transversalis fascia was opened, and the bladder was exposed.  The bladder was filled with sterile water  in an attempt to overdistend. This was not possible since we did not have access to the bladder. Nonetheless, the bladder was grasped with Alice clamps and we made a longitudinal incision in the bladder. The bladder was drained. Antegrade cystoscopy was performed. Findings noted above.  A 2-0 Vicryl purse string was placed, and tied down after a catheter was inserted. The catheter was irrigated to ensure that it drained well.   The wound was irrigated copriously. Fascia was closed with 0 PDS. Scarpa's fascia was re-approximated with Vicryl. Skin was closed with clips.  A dressing was applied. He was awoken from anesthesia without difficulty.  The procedure was tolerated well. There were no immediate complications.  I, Arvella Kays, was present, participating and scrubbed for the duration of the procedure.  PLAN OK to discharge when he meets PACU criteria Follow-up with Micah in 2 weeks for staple removal Follow-up with me in 4 weeks for SP tube change Message sent to Lorie to schedule  I, Arvella Kays, was present, participating and scrubbed for the duration of the procedure.  Urology Contact Information 7a-5p - 1st call: Urology [facility] Day Call pager 5p-7a - 1st call: [facility] Admit/Consult pager 5p-7a - 2nd call: Urology Chief Resident on Call

## 2019-09-23 NOTE — Interval H&P Note (Signed)
 DAY OF SURGERY UPDATE    H&P reviewed. The patient was examined and there are no changes to the H&P.

## 2019-10-05 DIAGNOSIS — N35912 Unspecified bulbous urethral stricture, male: Secondary | ICD-10-CM | POA: Diagnosis not present

## 2019-10-05 DIAGNOSIS — Z09 Encounter for follow-up examination after completed treatment for conditions other than malignant neoplasm: Secondary | ICD-10-CM | POA: Diagnosis not present

## 2019-10-14 DIAGNOSIS — N35014 Post-traumatic urethral stricture, male, unspecified: Secondary | ICD-10-CM | POA: Diagnosis not present

## 2019-10-24 DIAGNOSIS — Z09 Encounter for follow-up examination after completed treatment for conditions other than malignant neoplasm: Secondary | ICD-10-CM | POA: Diagnosis not present

## 2019-10-24 DIAGNOSIS — N35014 Post-traumatic urethral stricture, male, unspecified: Secondary | ICD-10-CM | POA: Diagnosis not present

## 2019-10-31 DIAGNOSIS — G5601 Carpal tunnel syndrome, right upper limb: Secondary | ICD-10-CM | POA: Diagnosis not present

## 2019-10-31 DIAGNOSIS — M1991 Primary osteoarthritis, unspecified site: Secondary | ICD-10-CM | POA: Diagnosis not present

## 2019-10-31 DIAGNOSIS — M5412 Radiculopathy, cervical region: Secondary | ICD-10-CM | POA: Diagnosis not present

## 2019-11-08 ENCOUNTER — Telehealth: Payer: Self-pay

## 2019-11-14 DIAGNOSIS — Z435 Encounter for attention to cystostomy: Secondary | ICD-10-CM | POA: Diagnosis not present

## 2019-11-14 DIAGNOSIS — N35014 Post-traumatic urethral stricture, male, unspecified: Secondary | ICD-10-CM | POA: Diagnosis not present

## 2019-11-23 NOTE — Telephone Encounter (Signed)
error 

## 2019-12-16 DIAGNOSIS — N35812 Other urethral bulbous stricture, male: Secondary | ICD-10-CM | POA: Diagnosis not present

## 2020-01-05 DIAGNOSIS — E782 Mixed hyperlipidemia: Secondary | ICD-10-CM | POA: Diagnosis not present

## 2020-01-05 DIAGNOSIS — I10 Essential (primary) hypertension: Secondary | ICD-10-CM | POA: Diagnosis not present

## 2020-01-05 DIAGNOSIS — I700 Atherosclerosis of aorta: Secondary | ICD-10-CM | POA: Diagnosis not present

## 2020-01-05 DIAGNOSIS — Z23 Encounter for immunization: Secondary | ICD-10-CM | POA: Diagnosis not present

## 2020-01-05 DIAGNOSIS — Z0000 Encounter for general adult medical examination without abnormal findings: Secondary | ICD-10-CM | POA: Diagnosis not present

## 2020-01-05 DIAGNOSIS — R918 Other nonspecific abnormal finding of lung field: Secondary | ICD-10-CM | POA: Diagnosis not present

## 2020-01-09 DIAGNOSIS — M25551 Pain in right hip: Secondary | ICD-10-CM | POA: Diagnosis not present

## 2020-01-09 DIAGNOSIS — M1611 Unilateral primary osteoarthritis, right hip: Secondary | ICD-10-CM | POA: Diagnosis not present

## 2020-01-17 DIAGNOSIS — N35912 Unspecified bulbous urethral stricture, male: Secondary | ICD-10-CM | POA: Diagnosis not present

## 2020-01-18 DIAGNOSIS — M25551 Pain in right hip: Secondary | ICD-10-CM | POA: Diagnosis not present

## 2020-01-18 DIAGNOSIS — M1611 Unilateral primary osteoarthritis, right hip: Secondary | ICD-10-CM | POA: Diagnosis not present

## 2020-02-17 DIAGNOSIS — R339 Retention of urine, unspecified: Secondary | ICD-10-CM | POA: Diagnosis not present

## 2020-03-14 DIAGNOSIS — K5900 Constipation, unspecified: Secondary | ICD-10-CM | POA: Diagnosis not present

## 2020-03-23 DIAGNOSIS — N35912 Unspecified bulbous urethral stricture, male: Secondary | ICD-10-CM | POA: Diagnosis not present

## 2020-04-27 DIAGNOSIS — Z466 Encounter for fitting and adjustment of urinary device: Secondary | ICD-10-CM | POA: Diagnosis not present

## 2020-05-25 DIAGNOSIS — Z466 Encounter for fitting and adjustment of urinary device: Secondary | ICD-10-CM | POA: Diagnosis not present

## 2020-06-25 NOTE — Progress Notes (Signed)
06/26/2020 10:57 AM   Brady Hanna [birth year] [id number]  Referring provider: Lauro Regulus, MD [address] [facility] [place],  [place] [address]  Chief Complaint  Patient presents with  . Urinary Retention   Urological history: 1. Prostate cancer - RRP 1994  2. Urethral stricture - failed DVIU 2016 - SPT placed by Dr. Guy Sandifer 08/2019  3. Phimosis - circumcision 2016   HPI: Brady Hanna is a 87 y.o. who presents today to reestablish care with [place] for his SPT management.   He is using a catheter plug during the day and unplugs the catheter when he feels the urge to urinate.  He uses an overnight bag as well.  Patient denies any modifying or aggravating factors.  Patient denies any gross hematuria, dysuria or suprapubic/flank pain.  Patient denies any fevers, chills, nausea or vomiting.   PMH: Past Medical History:  Diagnosis Date  . Prostate cancer [facility])    prostate  . Skin cancer    ear  . Ureteral stricture    recurrent, requiring in-dwelling Foleys and dilitation in the past    Surgical History: Past Surgical History:  Procedure Laterality Date  . APPENDECTOMY    . CATARACT EXTRACTION W/ INTRAOCULAR LENS IMPLANT Bilateral 2010 & 2012  . CHOLECYSTECTOMY    . CIRCUMCISION N/A 10/09/2014   Procedure: CIRCUMCISION ADULT;  Surgeon: Lorraine Lax, MD;  Location: [facility] ORS;  Service: Urology;  Laterality: N/A;  . CYSTOSCOPY WITH DIRECT VISION INTERNAL URETHROTOMY N/A 10/09/2014   Procedure: CYSTOSCOPY WITH DIRECT VISION INTERNAL URETHROTOMY;  Surgeon: Lorraine Lax, MD;  Location: [facility] ORS;  Service: Urology;  Laterality: N/A;  . CYSTOSCOPY WITH URETHRAL DILATATION N/A 12/24/2018   Procedure: CYSTOSCOPY WITH URETHRAL DILATATION CATHETER PLACEMENT;  Surgeon: Riki Altes, MD;  Location: [facility] ORS;  Service: Urology;  Laterality: N/A;  . CYSTOSCOPY WITH URETHRAL DILATATION N/A 03/24/2019   Procedure: CYSTOSCOPY WITH URETHRAL DILATATION;   Surgeon: Riki Altes, MD;  Location: [facility] ORS;  Service: Urology;  Laterality: N/A;  . PROSTATE SURGERY    . TONSILLECTOMY      Home Medications:  Allergies as of 06/26/2020      Reactions   Codeine    "makes him agitated" per daughter   Morphine And Related Itching   Sulphadimidine [sulfamethazine] Itching   Trazodone And Nefazodone    Keeps pt awake       Medication List       Accurate as of June 26, 2020 10:57 AM. If you have any questions, ask your nurse or doctor.        atorvastatin 20 MG tablet Commonly known as: LIPITOR Take 20 mg by mouth daily.   BIOFREEZE EX Apply 1 application topically daily as needed (muscle pain).   GENTEAL OP Place 1 drop into both eyes 2 (two) times daily.   ibuprofen 200 MG tablet Commonly known as: ADVIL Take 200 mg by mouth every 6 (six) hours as needed for mild pain.   metoprolol succinate 50 MG 24 hr tablet Commonly known as: TOPROL-XL Take 50 mg by mouth daily.   sodium chloride 0.65 % Soln nasal spray Commonly known as: OCEAN Place 1 spray into both nostrils as needed for congestion.   Vitamin D (Cholecalciferol) 10 MCG (400 UNIT) Caps Take 400 Units by mouth daily.   vitamin E 180 MG (400 UNITS) capsule Take 400 Units by mouth daily.       Allergies:  Allergies  Allergen Reactions  . Codeine     "makes him agitated" per daughter  . Morphine And Related Itching  . Sulphadimidine [Sulfamethazine] Itching  . Trazodone And Nefazodone     Keeps pt awake      Family History: Family History  Problem Relation Age of Onset  . Congestive Heart Failure Father   . Rectal cancer Sister   . Prostate cancer Brother     Social History:  reports that he has never smoked. He has never used smokeless tobacco. He reports that he does not drink alcohol and does not use drugs.  ROS: Pertinent ROS in HPI  Physical Exam: BP (!) 174/76   Pulse 70   Ht 6' (1.829 m)   Wt 235 lb (106.6 kg)   BMI 31.87 kg/m    Constitutional:  Well nourished. Alert and oriented, No acute distress. HEENT: [place] AT, mask in place.  Trachea midline Cardiovascular: No clubbing, cyanosis, or edema. Respiratory: Normal respiratory effort, no increased work of breathing. GU: No CVA tenderness.  No bladder fullness or masses.  SPT site clean and dry.  Patient with circumcised phallus.  Urethral meatus is patent.  No penile discharge. No penile lesions or rashes. Scrotum without lesions, cysts, rashes and/or edema.  Neurologic: Grossly intact, no focal deficits, moving all 4 extremities. Psychiatric: Normal mood and affect.  Laboratory Data: Specimen:  Blood  Ref Range & Units 5 mo ago  Glucose 70 - 110 mg/dL 960   Sodium 454 - 098 mmol/L 140   Potassium 3.6 - 5.1 mmol/L 4.2   Chloride 97 - 109 mmol/L 105   Carbon Dioxide (CO2) 22.0 - 32.0 mmol/L 31.4   Urea Nitrogen (BUN) 7 - 25 mg/dL 16   Creatinine 0.7 - 1.3 mg/dL 0.7   Glomerular Filtration Rate (eGFR), MDRD Estimate >60 mL/min/1.73sq m 107   Calcium 8.7 - 10.3 mg/dL 9.5   AST  8 - 39 U/L 15   ALT  6 - 57 U/L 13   Alk Phos (alkaline Phosphatase) 34 - 104 U/L 73   Albumin 3.5 - 4.8 g/dL 3.9   Bilirubin, Total 0.3 - 1.2 mg/dL 0.4   Protein, Total 6.1 - 7.9 g/dL 6.5   A/G Ratio 1.0 - 5.0 gm/dL 1.5   Resulting Agency  [facility]  Specimen Collected: 01/05/20 2:33 PM Last Resulted: 01/06/20 10:20 AM  Received From: [facility]  Result Received: 05/30/20 11:15 AM  I have reviewed the labs.  Pertinent Imaging: No recent imaging   Suprapubic Cath Change Patient is present today for a suprapubic catheter change due to urinary retention.  5 ml of water was drained from the balloon, a 18 FR Silicone foley cath was removed from the tract with out difficulty.  Site was cleaned and prepped in a sterile fashion with betadine.  A 18 FR foley cath was replaced into the tract no complications were noted. Urine return was noted, 10 ml of  sterile water was inflated into the balloon and a plug was attached.  Patient tolerated well. A night bag was given to patient.    Performed by: Michiel Cowboy, PA-C and Honor Loh, CMA  Assessment & Plan:    1. Urethral stricture - managed with SPT   2. Prostate cancer - RRP 1994  Return in about 1 month (around 07/24/2020) for SPT exchange.  These notes generated with voice recognition software. I apologize for typographical errors.  Debara Kamphuis, PA-C  [facility] [address]  [address] [place], [place] [address] [phone]

## 2020-06-26 ENCOUNTER — Encounter: Payer: Self-pay | Admitting: Urology

## 2020-06-26 ENCOUNTER — Other Ambulatory Visit: Payer: Self-pay

## 2020-06-26 ENCOUNTER — Ambulatory Visit (INDEPENDENT_AMBULATORY_CARE_PROVIDER_SITE_OTHER): Payer: Medicare Other | Admitting: Urology

## 2020-06-26 VITALS — BP 174/76 | HR 70 | Ht 72.0 in | Wt 235.0 lb

## 2020-06-26 DIAGNOSIS — N35912 Unspecified bulbous urethral stricture, male: Secondary | ICD-10-CM | POA: Diagnosis not present

## 2020-06-26 DIAGNOSIS — C61 Malignant neoplasm of prostate: Secondary | ICD-10-CM

## 2020-07-23 NOTE — Progress Notes (Signed)
Suprapubic Cath Change  Patient is present today for a suprapubic catheter change due to urinary retention.  8 ml of water was drained from the balloon, an 18 FR foley cath was removed from the tract with out difficulty.  Site was cleaned and prepped in a sterile fashion with betadine.  An 17 FR foley cath was replaced into the tract no complications were noted. Urine return was noted, 10 ml of sterile water was inflated into the balloon and a catheter plug was placed in the catheter.  Patient tolerated well. A night bag was given to patient.  Performed by: Myself and Kyra Manges, CMA  Follow up: One month follow up for SPT exchange

## 2020-07-24 ENCOUNTER — Ambulatory Visit (INDEPENDENT_AMBULATORY_CARE_PROVIDER_SITE_OTHER): Payer: Medicare Other | Admitting: Urology

## 2020-07-24 ENCOUNTER — Other Ambulatory Visit: Payer: Self-pay

## 2020-07-24 DIAGNOSIS — Z9359 Other cystostomy status: Secondary | ICD-10-CM | POA: Diagnosis not present

## 2020-08-27 NOTE — Progress Notes (Signed)
Suprapubic Cath Change  Patient is present today for a suprapubic catheter change due to urinary retention.  9 ml of water was drained from the balloon, a 18 FR foley cath was removed from the tract with out difficulty.  Site was cleaned and prepped in a sterile fashion with betadine.  A 18 FR foley cath was replaced into the tract no complications were noted. Urine return was noted, 10 ml of sterile water was inflated into the balloon and a catheter plug was attached.  Patient tolerated well. A night bag was given to patient.  Performed by: Myself  Follow up: One month

## 2020-08-28 ENCOUNTER — Encounter: Payer: Self-pay | Admitting: Urology

## 2020-08-28 ENCOUNTER — Other Ambulatory Visit: Payer: Self-pay

## 2020-08-28 ENCOUNTER — Ambulatory Visit (INDEPENDENT_AMBULATORY_CARE_PROVIDER_SITE_OTHER): Payer: Medicare Other | Admitting: Urology

## 2020-08-28 DIAGNOSIS — Z9359 Other cystostomy status: Secondary | ICD-10-CM

## 2020-09-26 NOTE — Progress Notes (Deleted)
Suprapubic Cath Change  Patient is present today for a suprapubic catheter change due to urinary retention.  ***ml of water was drained from the balloon, a ***FR foley cath was removed from the tract with out difficulty.  Site was cleaned and prepped in a sterile fashion with betadine.  A ***FR foley cath was replaced into the tract {dnt complications:20057}. Urine return was noted, 10 ml of sterile water was inflated into the balloon and a *** bag was attached for drainage.  Patient tolerated well. A night bag was given to patient and proper instruction was given on how to switch bags.    Preformed by: ***  Follow up: ***  

## 2020-09-27 ENCOUNTER — Ambulatory Visit: Payer: Self-pay | Admitting: Urology

## 2020-09-27 DIAGNOSIS — Z9359 Other cystostomy status: Secondary | ICD-10-CM

## 2020-10-08 NOTE — Progress Notes (Signed)
Suprapubic Cath Change  Patient is present today for a suprapubic catheter change due to urinary retention.  9 ml of water was drained from the balloon, a 18 FR foley cath was removed from the tract with out difficulty.  Site was cleaned and prepped in a sterile fashion with betadine.  A 18 FR coude foley cath was replaced into the tract no complications were noted. Urine return was noted.  His catheter was plugged.    Performed by: Myself  Follow up:  He will follow up in one month SPT exchange.  He will talk with his daughter about bringing her for me to instruct her in changing the SPT so that he will not have to come as often.  He will talk with her.  Will need follow up imaging and cysto in 2025

## 2020-10-09 ENCOUNTER — Ambulatory Visit (INDEPENDENT_AMBULATORY_CARE_PROVIDER_SITE_OTHER): Payer: Medicare Other | Admitting: Urology

## 2020-10-09 ENCOUNTER — Other Ambulatory Visit: Payer: Self-pay

## 2020-10-09 DIAGNOSIS — Z9359 Other cystostomy status: Secondary | ICD-10-CM | POA: Diagnosis not present

## 2020-11-06 NOTE — Progress Notes (Signed)
Suprapubic Cath Change  Patient is present today for a suprapubic catheter change due to urinary retention.  9 ml of water was drained from the balloon, a 18 FR coude foley cath was removed from the tract with out difficulty.  Site was cleaned and prepped in a sterile fashion with betadine.  A 18 FR foley cath was replaced into the tract no complications were noted. Urine return was noted, 10 ml of sterile water was inflated into the balloon.  Catheter was plugged. Patient tolerated well. A night bag was given to patient and proper instruction was given on how to switch bags.    Performed by: Zara Council, PA-C  Follow up: One month for SPT exchange

## 2020-11-07 ENCOUNTER — Ambulatory Visit (INDEPENDENT_AMBULATORY_CARE_PROVIDER_SITE_OTHER): Payer: Medicare Other | Admitting: Urology

## 2020-11-07 ENCOUNTER — Other Ambulatory Visit: Payer: Self-pay

## 2020-11-07 DIAGNOSIS — Z9359 Other cystostomy status: Secondary | ICD-10-CM | POA: Diagnosis not present

## 2020-11-12 ENCOUNTER — Other Ambulatory Visit: Payer: Self-pay | Admitting: Internal Medicine

## 2020-11-12 DIAGNOSIS — R918 Other nonspecific abnormal finding of lung field: Secondary | ICD-10-CM

## 2020-11-27 ENCOUNTER — Other Ambulatory Visit: Payer: Self-pay

## 2020-11-27 ENCOUNTER — Ambulatory Visit
Admission: RE | Admit: 2020-11-27 | Discharge: 2020-11-27 | Disposition: A | Discharge status: Home / Self Care | Payer: Medicare Other | Source: Ambulatory Visit | Attending: Internal Medicine | Admitting: Internal Medicine

## 2020-11-27 DIAGNOSIS — R918 Other nonspecific abnormal finding of lung field: Secondary | ICD-10-CM | POA: Diagnosis not present

## 2020-12-06 ENCOUNTER — Ambulatory Visit (INDEPENDENT_AMBULATORY_CARE_PROVIDER_SITE_OTHER): Payer: Medicare Other | Admitting: Physician Assistant

## 2020-12-06 ENCOUNTER — Other Ambulatory Visit: Payer: Self-pay

## 2020-12-06 DIAGNOSIS — Z9359 Other cystostomy status: Secondary | ICD-10-CM

## 2020-12-06 NOTE — Progress Notes (Signed)
Suprapubic Cath Change  Patient is present today for a suprapubic catheter change due to urinary retention.  74ml of water was drained from the balloon, a 18FR foley cath was removed from the tract with out difficulty.  Site was cleaned and prepped in a sterile fashion with betadine.  A 18FR foley cath was replaced into the tract no complications were noted. Urine return was noted, 10 ml of sterile water was inflated into the balloon and catheter was plugged.  Patient tolerated well.     Performed by: Debroah Loop, PA-C   Follow up: Return in about 4 weeks (around 01/03/2021) for SPT exchange.

## 2021-01-03 ENCOUNTER — Encounter: Payer: Self-pay | Admitting: Gastroenterology

## 2021-01-03 ENCOUNTER — Other Ambulatory Visit: Payer: Self-pay

## 2021-01-03 ENCOUNTER — Ambulatory Visit (INDEPENDENT_AMBULATORY_CARE_PROVIDER_SITE_OTHER): Payer: Medicare Other | Admitting: Gastroenterology

## 2021-01-03 VITALS — BP 132/64 | HR 72 | Ht 72.0 in | Wt 225.8 lb

## 2021-01-03 DIAGNOSIS — K5900 Constipation, unspecified: Secondary | ICD-10-CM

## 2021-01-03 DIAGNOSIS — R194 Change in bowel habit: Secondary | ICD-10-CM

## 2021-01-03 MED ORDER — NA SULFATE-K SULFATE-MG SULF 17.5-3.13-1.6 GM/177ML PO SOLN: 1.0000 | ORAL | 0 refills | Status: DC

## 2021-01-03 NOTE — Progress Notes (Signed)
Gastroenterology Consultation  Referring Provider:     Kirk Ruths, MD Primary Care Physician:  Kirk Ruths, MD Primary Gastroenterologist:  Dr. Allen Hanna     Reason for Consultation:     Constipation        HPI:   Brady Hanna is a 87 y.o. y/o male referred for consultation & management of constipation by Dr. Ouida Sills, Ocie Cornfield, MD. this patient comes to see me for a history of constipation.  It appears that the patient was seen by a [place] [facility] GI within the last year and it appears that it was in October 2021.  At that time the recommendations were:  ASSESSMENT AND PLAN: Brady Hanna is a 87 y.o. male presenting for an initial consultation. Diagnoses and all orders for this visit:  Constipation, unspecified constipation type - linaCLOtide (LINZESS) 145 mcg capsule; Take 1 capsule (145 mcg total) by mouth once daily  The patient then called his primary care provider recently and reported that he wanted a referral to GI.   She says the symptoms are worse since the supra pubic catheter was placed in 2021. He lost 20lbs with eating better.  The patient has been taking lactulose and reports that he is doing much better with His bowel movements.  There is no report of any black stools or bloody stools.  The patient states that he is overdue for a repeat colonoscopy and is concerned because of his constipation.  Past Medical History:  Diagnosis Date   Prostate cancer [facility])    prostate   Skin cancer    ear   Ureteral stricture    recurrent, requiring in-dwelling Foleys and dilitation in the past    Past Surgical History:  Procedure Laterality Date   APPENDECTOMY     CATARACT EXTRACTION W/ INTRAOCULAR LENS IMPLANT Bilateral 2010 & 2012   CHOLECYSTECTOMY     CIRCUMCISION N/A 10/09/2014   Procedure: CIRCUMCISION ADULT;  Surgeon: Collier Flowers, MD;  Location: [facility] ORS;  Service: Urology;  Laterality: N/A;   CYSTOSCOPY WITH DIRECT VISION INTERNAL URETHROTOMY N/A  10/09/2014   Procedure: CYSTOSCOPY WITH DIRECT VISION INTERNAL URETHROTOMY;  Surgeon: Collier Flowers, MD;  Location: [facility] ORS;  Service: Urology;  Laterality: N/A;   CYSTOSCOPY WITH URETHRAL DILATATION N/A 12/24/2018   Procedure: CYSTOSCOPY WITH URETHRAL DILATATION CATHETER PLACEMENT;  Surgeon: Abbie Sons, MD;  Location: [facility] ORS;  Service: Urology;  Laterality: N/A;   CYSTOSCOPY WITH URETHRAL DILATATION N/A 03/24/2019   Procedure: CYSTOSCOPY WITH URETHRAL DILATATION;  Surgeon: Abbie Sons, MD;  Location: [facility] ORS;  Service: Urology;  Laterality: N/A;   PROSTATE SURGERY     TONSILLECTOMY      Prior to Admission medications   Medication Sig Start Date End Date Taking? Authorizing Provider  atorvastatin (LIPITOR) 20 MG tablet Take 20 mg by mouth daily. 06/28/18 06/28/19  [provider]  Carboxymethylcell-Hypromellose (GENTEAL OP) Place 1 drop into both eyes 2 (two) times daily.    [provider]  ibuprofen (ADVIL,MOTRIN) 200 MG tablet Take 200 mg by mouth every 6 (six) hours as needed for mild pain.    [provider]  Menthol, Topical Analgesic, (BIOFREEZE EX) Apply 1 application topically daily as needed (muscle pain).    [provider]  metoprolol succinate (TOPROL-XL) 50 MG 24 hr tablet Take 50 mg by mouth daily. 05/20/18 05/20/19  [provider]  sodium chloride (OCEAN) 0.65 % SOLN nasal spray Place 1 spray into both nostrils  as needed for congestion.    [provider]  Vitamin D, Cholecalciferol, 10 MCG (400 UNIT) CAPS Take 400 Units by mouth daily.    [provider]  vitamin E 400 UNIT capsule Take 400 Units by mouth daily.    [provider]    Family History  Problem Relation Age of Onset   Congestive Heart Failure Father    Rectal cancer Sister    Prostate cancer Brother      Social History   Tobacco Use   Smoking status: Never   Smokeless tobacco: Never  Substance Use Topics   Alcohol use: No    Drug use: No    Allergies as of 01/03/2021 - Review Complete 08/28/2020  Allergen Reaction Noted   Codeine  10/09/2014   Morphine and related Itching 10/04/2014   Sulphadimidine [sulfamethazine] Itching 10/04/2014   Trazodone and nefazodone  03/23/2019    Review of Systems:    All systems reviewed and negative except where noted in HPI.   Physical Exam:  There were no vitals taken for this visit. No LMP for male patient. General:   Alert,  Well-developed, well-nourished, pleasant and cooperative in NAD Head:  Normocephalic and atraumatic. Eyes:  Sclera clear, no icterus.   Conjunctiva pink. Ears:  Normal auditory acuity. Neck:  Supple; no masses or thyromegaly. Lungs:  Respirations even and unlabored.  Clear throughout to auscultation.   No wheezes, crackles, or rhonchi. No acute distress. Heart:  Regular rate and rhythm; no murmurs, clicks, rubs, or gallops. Abdomen:  Normal bowel sounds.  No bruits.  Soft, non-tender and non-distended without masses, hepatosplenomegaly or hernias noted.  No guarding or rebound tenderness.  Negative Carnett sign.   Rectal:  Deferred.  Pulses:  Normal pulses noted. Extremities:  No clubbing or edema.  No cyanosis. Neurologic:  Alert and oriented x3;  grossly normal neurologically. Skin:  Intact without significant lesions or rashes.  No jaundice. Lymph Nodes:  No significant cervical adenopathy. Psych:  Alert and cooperative. Normal mood and affect.  Imaging Studies: No results found.  Assessment and Plan:   Brady Hanna is a 87 y.o. y/o male who comes in today with a history of constipation that started approximate 1 year ago when he had his suprapubic catheter placed.  The patient has been reassured that the suprapubic catheter is not causing his constipation.  The patient is younger than his stated age and is relatively healthy. The patient will be set up for a colonoscopy and he has been told to continue the lactulose.  The patient was  explained the plan and agrees with it.   Lucilla Lame, MD. [facility] Regal    Note: This dictation was prepared with Dragon dictation along with smaller phrase technology. Any transcriptional errors that result from this process are unintentional.

## 2021-01-04 ENCOUNTER — Other Ambulatory Visit: Payer: Self-pay

## 2021-01-04 DIAGNOSIS — R194 Change in bowel habit: Secondary | ICD-10-CM

## 2021-01-04 DIAGNOSIS — K5900 Constipation, unspecified: Secondary | ICD-10-CM

## 2021-01-08 ENCOUNTER — Ambulatory Visit (INDEPENDENT_AMBULATORY_CARE_PROVIDER_SITE_OTHER): Payer: Medicare Other | Admitting: Urology

## 2021-01-08 ENCOUNTER — Other Ambulatory Visit: Payer: Self-pay

## 2021-01-08 DIAGNOSIS — Z9359 Other cystostomy status: Secondary | ICD-10-CM | POA: Diagnosis not present

## 2021-01-08 NOTE — Progress Notes (Signed)
Suprapubic Cath Change  Patient is present today for a suprapubic catheter change due to urinary retention.  9 ml of water was drained from the balloon, a 18 FR foley cath was removed from the tract with out difficulty.  Site was cleaned and prepped in a sterile fashion with betadine.  A 18 FR foley cath was replaced into the tract no complications were noted. Urine return was noted, 10 ml of sterile water was inflated into the balloon and a catheter plug was attached.   Patient tolerated well. A night bag was given to patient.  Performed by: Zara Council, PA-C and Norton Blizzard, CMA  Follow up: 40 days for SPT exchange

## 2021-02-05 ENCOUNTER — Telehealth: Payer: Self-pay | Admitting: Gastroenterology

## 2021-02-05 NOTE — Telephone Encounter (Signed)
Question about prep for procedure tomorrow

## 2021-02-06 NOTE — Telephone Encounter (Signed)
Returned pt's call and discussed with him how to take his Golytely medication.

## 2021-02-07 ENCOUNTER — Ambulatory Visit
Admission: RE | Admit: 2021-02-07 | Discharge: 2021-02-07 | Disposition: A | Discharge status: Home / Self Care | Payer: Medicare Other | Attending: Gastroenterology | Admitting: Gastroenterology

## 2021-02-07 ENCOUNTER — Ambulatory Visit: Payer: Medicare Other | Admitting: Certified Registered"

## 2021-02-07 ENCOUNTER — Encounter: Payer: Self-pay | Admitting: Gastroenterology

## 2021-02-07 ENCOUNTER — Encounter
Admission: RE | Disposition: A | Discharge status: Home / Self Care | Payer: Self-pay | Source: Home / Self Care | Attending: Gastroenterology

## 2021-02-07 DIAGNOSIS — Z885 Allergy status to narcotic agent status: Secondary | ICD-10-CM | POA: Insufficient documentation

## 2021-02-07 DIAGNOSIS — Z882 Allergy status to sulfonamides status: Secondary | ICD-10-CM | POA: Diagnosis not present

## 2021-02-07 DIAGNOSIS — K641 Second degree hemorrhoids: Secondary | ICD-10-CM | POA: Diagnosis not present

## 2021-02-07 DIAGNOSIS — K5900 Constipation, unspecified: Secondary | ICD-10-CM

## 2021-02-07 DIAGNOSIS — Z7982 Long term (current) use of aspirin: Secondary | ICD-10-CM | POA: Diagnosis not present

## 2021-02-07 DIAGNOSIS — Z888 Allergy status to other drugs, medicaments and biological substances status: Secondary | ICD-10-CM | POA: Diagnosis not present

## 2021-02-07 DIAGNOSIS — K5730 Diverticulosis of large intestine without perforation or abscess without bleeding: Secondary | ICD-10-CM | POA: Insufficient documentation

## 2021-02-07 DIAGNOSIS — Z79899 Other long term (current) drug therapy: Secondary | ICD-10-CM | POA: Insufficient documentation

## 2021-02-07 DIAGNOSIS — R194 Change in bowel habit: Secondary | ICD-10-CM

## 2021-02-07 HISTORY — DX: Abdominal aortic aneurysm, without rupture: I71.4

## 2021-02-07 HISTORY — PX: COLONOSCOPY WITH PROPOFOL: SHX5780

## 2021-02-07 HISTORY — DX: Pneumonia, unspecified organism: J18.9

## 2021-02-07 HISTORY — DX: Other cystostomy status: Z93.59

## 2021-02-07 HISTORY — DX: Abdominal aortic aneurysm, without rupture, unspecified: I71.40

## 2021-02-07 HISTORY — DX: Calculus of kidney: N20.0

## 2021-02-07 HISTORY — DX: Asymptomatic varicose veins of bilateral lower extremities: I83.93

## 2021-02-07 HISTORY — DX: Hyperlipidemia, unspecified: E78.5

## 2021-02-07 HISTORY — DX: Cyst of pancreas: K86.2

## 2021-02-07 SURGERY — COLONOSCOPY WITH PROPOFOL
Anesthesia: General

## 2021-02-07 MED ORDER — PROPOFOL 500 MG/50ML IV EMUL
INTRAVENOUS | Status: DC | PRN
  Administered 2021-02-07: 75 ug/kg/min via INTRAVENOUS

## 2021-02-07 MED ORDER — PROPOFOL 10 MG/ML IV BOLUS
INTRAVENOUS | Status: DC | PRN
  Administered 2021-02-07: 15 mg via INTRAVENOUS
  Administered 2021-02-07: 30 mg via INTRAVENOUS
  Administered 2021-02-07: 15 mg via INTRAVENOUS
  Administered 2021-02-07: 40 mg via INTRAVENOUS

## 2021-02-07 MED ORDER — SODIUM CHLORIDE 0.9 % IV SOLN: INTRAVENOUS | Status: DC

## 2021-02-07 MED ORDER — PROPOFOL 10 MG/ML IV BOLUS
INTRAVENOUS | Status: AC
  Filled 2021-02-07: qty 20

## 2021-02-07 MED ORDER — LIDOCAINE HCL (CARDIAC) PF 100 MG/5ML IV SOSY
PREFILLED_SYRINGE | INTRAVENOUS | Status: DC | PRN
  Administered 2021-02-07: 50 mg via INTRAVENOUS

## 2021-02-07 NOTE — Op Note (Signed)
[facility] Gastroenterology Patient Name: Brady Hanna Procedure Date: 02/07/2021 11:18 AM MRN: [id number] Account #: [id number] Date of Birth: 1931/03/19 Admit Type: Outpatient Age: 87 Room: [facility] ENDO ROOM 4 Gender: Male Note Status: Finalized Instrument Name: Jasper Riling [id number] Procedure:             Colonoscopy Indications:           Constipation Providers:             Lucilla Lame MD, MD Referring MD:          Ocie Cornfield. Ouida Sills MD, MD (Referring MD) Medicines:             Propofol per Anesthesia Complications:         No immediate complications. Procedure:             Pre-Anesthesia Assessment:                        - Prior to the procedure, a History and Physical was                         performed, and patient medications and allergies were                         reviewed. The patient's tolerance of previous                         anesthesia was also reviewed. The risks and benefits                         of the procedure and the sedation options and risks                         were discussed with the patient. All questions were                         answered, and informed consent was obtained. Prior                         Anticoagulants: The patient has taken no previous                         anticoagulant or antiplatelet agents. ASA Grade                         Assessment: II - A patient with mild systemic disease.                         After reviewing the risks and benefits, the patient                         was deemed in satisfactory condition to undergo the                         procedure.                        After obtaining informed consent, the colonoscope was  passed under direct vision. Throughout the procedure,                         the patient's blood pressure, pulse, and oxygen                         saturations were monitored continuously. The                         Colonoscope was introduced  through the anus and                         advanced to the the cecum, identified by appendiceal                         orifice and ileocecal valve. The colonoscopy was                         performed without difficulty. The patient tolerated                         the procedure well. The quality of the bowel                         preparation was poor. Findings:      The perianal and digital rectal examinations were normal.      Multiple small-mouthed diverticula were found in the sigmoid colon.      Non-bleeding internal hemorrhoids were found during retroflexion. The       hemorrhoids were Grade II (internal hemorrhoids that prolapse but reduce       spontaneously). Impression:            - Preparation of the colon was poor.                        - Diverticulosis in the sigmoid colon.                        - Non-bleeding internal hemorrhoids.                        - No specimens collected. Recommendation:        - Discharge patient to home.                        - Resume previous diet.                        - Continue present medications. Procedure Code(s):     --- Professional ---                        [phone], Colonoscopy, flexible; diagnostic, including                         collection of specimen(s) by brushing or washing, when                         performed (separate procedure) Diagnosis Code(s):     --- Professional ---  K59.00, Constipation, unspecified CPT copyright 2019 American Medical Association. All rights reserved. The codes documented in this report are preliminary and upon coder review may  be revised to meet current compliance requirements. Lucilla Lame MD, MD 02/07/2021 11:43:43 AM This report has been signed electronically. Number of Addenda: 0 Note Initiated On: 02/07/2021 11:18 AM Scope Withdrawal Time: 0 hours 4 minutes 4 seconds  Total Procedure Duration: 0 hours 15 minutes 40 seconds  Estimated Blood Loss:  Estimated  blood loss: none.      [facility]

## 2021-02-07 NOTE — Anesthesia Postprocedure Evaluation (Signed)
Anesthesia Post Note  Patient: Brady Hanna  Procedure(s) Performed: COLONOSCOPY WITH PROPOFOL  Patient location during evaluation: PACU Anesthesia Type: General Level of consciousness: awake and alert Pain management: pain level controlled Vital Signs Assessment: post-procedure vital signs reviewed and stable Respiratory status: spontaneous breathing, nonlabored ventilation, respiratory function stable and patient connected to nasal cannula oxygen Cardiovascular status: blood pressure returned to baseline and stable Postop Assessment: no apparent nausea or vomiting Anesthetic complications: no   No notable events documented.   Last Vitals:  Vitals:   02/07/21 1145 02/07/21 1155  BP: 134/66 131/71  Pulse: 66   Resp: (!) 21   Temp: (!) 36.4 C   SpO2: 98%     Last Pain:  Vitals:   02/07/21 1205  TempSrc:   PainSc: 0-No pain                 [place]

## 2021-02-07 NOTE — Transfer of Care (Signed)
Immediate Anesthesia Transfer of Care Note  Patient: Brady Hanna  Procedure(s) Performed: COLONOSCOPY WITH PROPOFOL  Patient Location: PACU  Anesthesia Type:MAC  Level of Consciousness: awake, alert  and oriented  Airway & Oxygen Therapy: Patient Spontanous Breathing  Post-op Assessment: Report given to RN and Post -op Vital signs reviewed and stable  Post vital signs: stable  Last Vitals:  Vitals Value Taken Time  BP 134/66 02/07/21 1146  Temp 36.4 C 02/07/21 1145  Pulse 68 02/07/21 1148  Resp 21 02/07/21 1148  SpO2 98 % 02/07/21 1148  Vitals shown include unvalidated device data.  Last Pain:  Vitals:   02/07/21 1145  TempSrc: Temporal  PainSc: 0-No pain         Complications: No notable events documented.

## 2021-02-07 NOTE — Anesthesia Preprocedure Evaluation (Signed)
Anesthesia Evaluation  Patient identified by MRN, date of birth, ID band Patient awake    Reviewed: Allergy & Precautions, H&P , NPO status , Patient's Chart, lab work & pertinent test results  History of Anesthesia Complications Negative for: history of anesthetic complications  Airway Mallampati: II  TM Distance: >3 FB Neck ROM: Full    Dental  (+) Chipped, Missing, Poor Dentition,    Pulmonary neg pulmonary ROS,    Pulmonary exam normal        Cardiovascular hypertension, Normal cardiovascular exam  stable ascending thoracic aortic aneurysm measuring up to 4.2 cm in diameter  LAFB   Neuro/Psych negative neurological ROS  negative psych ROS   GI/Hepatic Neg liver ROS, Pancreatic cyst   Endo/Other  negative endocrine ROS  Renal/GU Kidney stones     Suprapubic catheter    Musculoskeletal  (+) Arthritis ,   Abdominal   Peds  Hematology Prostate cancer    Anesthesia Other Findings Prostate cancer.  Reproductive/Obstetrics                             Anesthesia Physical Anesthesia Plan  ASA: 3  Anesthesia Plan: General   Post-op Pain Management:    Induction:   PONV Risk Score and Plan:   Airway Management Planned: Natural Airway  Additional Equipment:   Intra-op Plan:   Post-operative Plan:   Informed Consent: I have reviewed the patients History and Physical, chart, labs and discussed the procedure including the risks, benefits and alternatives for the proposed anesthesia with the patient or authorized representative who has indicated his/her understanding and acceptance.       Plan Discussed with: CRNA  Anesthesia Plan Comments:         Anesthesia Quick Evaluation

## 2021-02-07 NOTE — H&P (Signed)
Lucilla Lame, MD [address] [place], [place] [address] [phone] Fax : [phone]  Primary Care Physician:  Kirk Ruths, MD Primary Gastroenterologist:  Dr. Allen Norris  Pre-Procedure History & Physical: HPI:  Brady Hanna is a 87 y.o. male is here for an colonoscopy.   Past Medical History:  Diagnosis Date   Hyperlipidemia    Nephrolithiasis    Prostate cancer [facility])    prostate   Skin cancer    ear   Ureteral stricture    recurrent, requiring in-dwelling [place] and dilitation in the past    Past Surgical History:  Procedure Laterality Date   APPENDECTOMY     CATARACT EXTRACTION W/ INTRAOCULAR LENS IMPLANT Bilateral 2010 & 2012   CHOLECYSTECTOMY     CIRCUMCISION N/A 10/09/2014   Procedure: CIRCUMCISION ADULT;  Surgeon: Collier Flowers, MD;  Location: [facility] ORS;  Service: Urology;  Laterality: N/A;   CYSTOSCOPY WITH DIRECT VISION INTERNAL URETHROTOMY N/A 10/09/2014   Procedure: CYSTOSCOPY WITH DIRECT VISION INTERNAL URETHROTOMY;  Surgeon: Collier Flowers, MD;  Location: [facility] OR[facility];  Service: Urology;  Laterality: N/A;   CYSTOSCOPY WITH URETHRAL DILATATION N/A 12/24/2018   Procedure: CYSTOSCOPY WITH URETHRAL DILATATION CATHETER PLACEMENT;  Surgeon: Abbie Sons, MD;  Location: [facility] OR[facility];  Service: Urology;  Laterality: N/A;   CYSTOSCOPY WITH URETHRAL DILATATION N/A 03/24/2019   Procedure: CYSTOSCOPY WITH URETHRAL DILATATION;  Surgeon: Abbie Sons, MD;  Location: [facility] ORS;  Service: Urology;  Laterality: N/A;   PROSTATE SURGERY     SIGMOIDOSCOPY     TONSILLECTOMY      Prior to Admission medications   Medication Sig Start Date End Date Taking? Authorizing Provider  aspirin 325 MG tablet Take 325 mg by mouth daily.   Yes [provider]  atorvastatin (LIPITOR) 20 MG tablet Take 20 mg by mouth daily.   Yes [provider]  atorvastatin (LIPITOR) 20 MG tablet Take 20 mg by mouth daily. 06/28/18 01/03/21  [provider]  Carboxymethylcell-Hypromellose (GENTEAL OP) Place 1 drop into both eyes 2 (two) times daily. Patient not taking: Reported on 01/03/2021    [provider]  ibuprofen (ADVIL,MOTRIN) 200 MG tablet Take 200 mg by mouth every 6 (six) hours as needed for mild pain. Patient not taking: Reported on 01/03/2021    [provider]  Menthol, Topical Analgesic, (BIOFREEZE EX) Apply 1 application topically daily as needed (muscle pain). Patient not taking: Reported on 01/03/2021    [provider]  metoprolol succinate (TOPROL-XL) 50 MG 24 hr tablet Take 50 mg by mouth daily. 05/20/18 05/20/19  [provider]  Na Sulfate-K Sulfate-Mg Sulf (SUPREP BOWEL PREP KIT) 17.5-3.13-1.6 GM/177ML SOLN Take 1 kit by mouth as directed. 01/03/21   Lucilla Lame, MD  sodium chloride (OCEAN) 0.65 % SOLN nasal spray Place 1 spray into both nostrils as needed for congestion. Patient not taking: Reported on 01/03/2021    [provider]  Vitamin D, Cholecalciferol, 10 MCG (400 UNIT) CAPS Take 400 Units by mouth daily. Patient not taking: Reported on 01/03/2021    [provider]  vitamin E 400 UNIT capsule Take 400 Units by mouth daily. Patient not taking: Reported on 01/03/2021    [provider]    Allergies as of 01/04/2021 - Review Complete 01/03/2021  Allergen Reaction Noted   Codeine  10/09/2014   Morphine and related Itching 10/04/2014   Sulphadimidine [sulfamethazine] Itching 10/04/2014   Trazodone and nefazodone  03/23/2019  Family History  Problem Relation Age of Onset   Congestive Heart Failure Father    Rectal cancer Sister    Prostate cancer Brother     Social History   Socioeconomic History   Marital status: Married    Spouse name: Not on file   Number of children: Not on file   Years of education: Not on file   Highest education level: Not on file  Occupational History   Not on file  Tobacco Use   Smoking status: Never   Smokeless  tobacco: Never  Vaping Use   Vaping Use: Never used  Substance and Sexual Activity   Alcohol use: No   Drug use: No   Sexual activity: Yes    Birth control/protection: None  Other Topics Concern   Not on file  Social History Narrative   Not on file   Social Determinants of Health   Financial Resource Strain: Not on file  Food Insecurity: Not on file  Transportation Needs: Not on file  Physical Activity: Not on file  Stress: Not on file  Social Connections: Not on file  Intimate Partner Violence: Not on file    Review of Systems: See HPI, otherwise negative ROS  Physical Exam: BP (!) 144/58   Pulse 79   Temp 97.9 F (36.6 C) (Temporal)   Resp (!) 22   Ht 6' (1.829 m)   Wt 102.1 kg   SpO2 99%   BMI 30.52 kg/m  General:   Alert,  pleasant and cooperative in NAD Head:  Normocephalic and atraumatic. Neck:  Supple; no masses or thyromegaly. Lungs:  Clear throughout to auscultation.    Heart:  Regular rate and rhythm. Abdomen:  Soft, nontender and nondistended. Normal bowel sounds, without guarding, and without rebound.   Neurologic:  Alert and  oriented x4;  grossly normal neurologically.  Impression/Plan: Brady Hanna is here for an colonoscopy to be performed for constipation  Risks, benefits, limitations, and alternatives regarding  colonoscopy have been reviewed with the patient.  Questions have been answered.  All parties agreeable.   Lucilla Lame, MD  02/07/2021, 11:10 AM

## 2021-02-08 ENCOUNTER — Encounter: Payer: Self-pay | Admitting: Gastroenterology

## 2021-02-18 NOTE — Progress Notes (Signed)
Suprapubic Cath Change  Patient is present today for a suprapubic catheter change due to urinary retention.  8 ml of water was drained from the balloon, a 18 FR foley cath was removed from the tract with out difficulty.  Site was cleaned and prepped in a sterile fashion with betadine.  A 18 FR foley cath was replaced into the tract no complications were noted. Urine return was noted, 10 ml of sterile water was inflated into the balloon and a plug was placed.  Patient tolerated well.   Performed by: Zara Council, PA-C   Follow up: 40 days for SPT exchanmge

## 2021-02-19 ENCOUNTER — Other Ambulatory Visit: Payer: Self-pay

## 2021-02-19 ENCOUNTER — Ambulatory Visit (INDEPENDENT_AMBULATORY_CARE_PROVIDER_SITE_OTHER): Payer: Medicare Other | Admitting: Urology

## 2021-02-19 ENCOUNTER — Encounter: Payer: Self-pay | Admitting: Urology

## 2021-02-19 DIAGNOSIS — Z9359 Other cystostomy status: Secondary | ICD-10-CM | POA: Diagnosis not present

## 2021-03-27 NOTE — Progress Notes (Signed)
Suprapubic Cath Change  Patient is present today for a suprapubic catheter change due to urinary retention.  9 ml of water was drained from the balloon, a 18 FR foley cath was removed from the tract with out difficulty.  Site was cleaned and prepped in a sterile fashion with betadine.  A 18 FR foley cath was replaced into the tract no complications were noted. Urine return was noted, 10 ml of sterile water was inflated into the balloon and a plug.  Patient tolerated well. A night bag was given to patient.  Performed by: Zara Council, PA-C  Follow up: 40 days for SPT exchange

## 2021-03-28 ENCOUNTER — Ambulatory Visit (INDEPENDENT_AMBULATORY_CARE_PROVIDER_SITE_OTHER): Payer: Medicare Other | Admitting: Urology

## 2021-03-28 ENCOUNTER — Other Ambulatory Visit: Payer: Self-pay

## 2021-03-28 DIAGNOSIS — Z9359 Other cystostomy status: Secondary | ICD-10-CM

## 2021-05-06 NOTE — Progress Notes (Signed)
Suprapubic Cath Change  Patient is present today for a suprapubic catheter change due to urinary retention.  9 ml of water was drained from the balloon, a 18 FR foley cath was removed from the tract with out difficulty.  Site was cleaned and prepped in a sterile fashion with betadine.  A 18 FR foley cath was replaced into the tract no complications were noted. Urine return was noted, 10 ml of sterile water was inflated into the balloon and a plug was attached.   Patient tolerated well.   Performed by: Zara Council, PA-C   Follow up: 40 days for SPT exchange

## 2021-05-07 ENCOUNTER — Ambulatory Visit (INDEPENDENT_AMBULATORY_CARE_PROVIDER_SITE_OTHER): Payer: Medicare Other | Admitting: Urology

## 2021-05-07 ENCOUNTER — Other Ambulatory Visit: Payer: Self-pay

## 2021-05-07 DIAGNOSIS — Z9359 Other cystostomy status: Secondary | ICD-10-CM

## 2021-06-07 ENCOUNTER — Other Ambulatory Visit: Payer: Self-pay | Admitting: Internal Medicine

## 2021-06-07 DIAGNOSIS — I10 Essential (primary) hypertension: Secondary | ICD-10-CM

## 2021-06-07 DIAGNOSIS — I700 Atherosclerosis of aorta: Secondary | ICD-10-CM

## 2021-06-07 DIAGNOSIS — R1900 Intra-abdominal and pelvic swelling, mass and lump, unspecified site: Secondary | ICD-10-CM

## 2021-06-17 NOTE — Progress Notes (Addendum)
Suprapubic Cath Change  Patient is present today for a suprapubic catheter change due to urinary retention.  9 ml of water was drained from the balloon, a 18 FR foley cath was removed from the tract with out difficulty.  Site was cleaned and prepped in a sterile fashion with betadine.  A 18 FR foley cath was replaced into the tract no complications were noted. Urine return was noted, 10 ml of sterile water was inflated into the balloon and a catheter plug was attached.  Patient tolerated well.   Performed by: Zara Council, PA-C   Follow up: Brady Hanna made mention that he had some issues with his catheter draining since its been placed.  He states that he has to push the catheter further inside his abdomen in order for it to drain more efficiently.  He also states that he had some burning and stinging at the suprapubic site and was prescribed Cipro by his PCP for 10 days.  He stated he would like another prescription on hand, but I explained to him that the pain was most likely due to the urine not being able to get out of the suprapubic tube due to the sediment buildup and that irrigation would help relieve that.  When I took the suprapubic tube out today, I noticed there was sediment around the tube and inside the tube.  I advised the patient that he needs to irrigate once a week and give him fresh irrigation supplies as he states he is familiar with the irrigation techniques.  We will put in a Silastic catheter at next visit.

## 2021-06-18 ENCOUNTER — Other Ambulatory Visit: Payer: Self-pay

## 2021-06-18 ENCOUNTER — Ambulatory Visit (INDEPENDENT_AMBULATORY_CARE_PROVIDER_SITE_OTHER): Payer: Medicare Other | Admitting: Urology

## 2021-06-18 DIAGNOSIS — Z9359 Other cystostomy status: Secondary | ICD-10-CM | POA: Diagnosis not present

## 2021-07-30 ENCOUNTER — Other Ambulatory Visit: Payer: Self-pay

## 2021-07-30 ENCOUNTER — Ambulatory Visit (INDEPENDENT_AMBULATORY_CARE_PROVIDER_SITE_OTHER): Payer: Medicare Other | Admitting: Urology

## 2021-07-30 DIAGNOSIS — Z9359 Other cystostomy status: Secondary | ICD-10-CM

## 2021-07-30 NOTE — Progress Notes (Signed)
Suprapubic Cath Change ? ?Patient is present today for a suprapubic catheter change due to urinary retention.  8 ml of water was drained from the balloon, a 18 FR foley cath was removed from the tract with out difficulty.  Site was cleaned and prepped in a sterile fashion with betadine.  A 18 FR Silastic foley cath was replaced into the tract no complications were noted. Urine return was noted, 10 ml of sterile water was inflated into the balloon and a plug was attached.  Patient tolerated well. A night bag and additional plug was given to patient and proper instruction was given on how to attach bag.   ? ?Performed by: Zara Council, PA-C  ? ?Follow up: 40 days for SPT exchange   ?

## 2021-08-09 ENCOUNTER — Ambulatory Visit: Payer: Medicare Other

## 2021-08-19 NOTE — Progress Notes (Signed)
? ? ?08/20/2021 ?3:10 PM  ? ?Brady Hanna ?August 04, 1930 ?[id number] ? ?Referring provider: Kirk Ruths, MD ?[place],  [place] [address] ? ?Chief Complaint  ?Patient presents with  ? Other  ? ?Urological history: ?1. Urethral stricture ?-s/p DVIU 2016 ?-urethral dilation 2020 x 2 ?-managed with a SPT with a plug ? ?2. Phimosis ?-circumcision 2016 ? ?3. Prostate cancer ?-prostatectomy 1994  ? ?HPI: ?Brady Hanna is a 88 y.o. male who presents today for this catheter leaking. ? ?He believes it is because the tubing is getting pinched in his overhang of his belly.  He wants to try a larger lumen catheter to see if it would not pinch as easily and therefore inhibit leakage around the SPT site of his urine. ? ?He states that he has had good urinary output and does not leave the catheter plugged for long periods of time. ? ?Patient denies any modifying or aggravating factors.  Patient denies any gross hematuria, dysuria or suprapubic/flank pain.  Patient denies any fevers, chills, nausea or vomiting.   ?   ? ?PMH: ?Past Medical History:  ?Diagnosis Date  ? AAA (abdominal aortic aneurysm)   ? Hyperlipidemia   ? Nephrolithiasis   ? Pancreatic cyst   ? Pneumonia   ? Prostate cancer ([place])   ? prostate  ? Skin cancer   ? ear  ? Suprapubic catheter ([place])   ? Ureteral stricture   ? recurrent, requiring in-dwelling [place] and dilitation in the past  ? Varicose veins of both lower extremities   ? ? ?Surgical History: ?Past Surgical History:  ?Procedure Laterality Date  ? APPENDECTOMY    ? CATARACT EXTRACTION W/ INTRAOCULAR LENS IMPLANT Bilateral 2010 & 2012  ? CHOLECYSTECTOMY    ? CIRCUMCISION N/A 10/09/2014  ? Procedure: CIRCUMCISION ADULT;  Surgeon: Collier Flowers, MD;  Location: [facility];  Service: Urology;  Laterality: N/A;  ? COLONOSCOPY WITH PROPOFOL N/A 02/07/2021  ? Procedure: COLONOSCOPY WITH PROPOFOL;  Surgeon: Lucilla Lame, MD;  Location: [facility] ENDOSCOPY;  Service:  Endoscopy;  Laterality: N/A;  ? CYSTOSCOPY WITH DIRECT VISION INTERNAL URETHROTOMY N/A 10/09/2014  ? Procedure: CYSTOSCOPY WITH DIRECT VISION INTERNAL URETHROTOMY;  Surgeon: Collier Flowers, MD;  Location: [facility] OR[facility];  Service: Urology;  Laterality: N/A;  ? CYSTOSCOPY WITH URETHRAL DILATATION N/A 12/24/2018  ? Procedure: CYSTOSCOPY WITH URETHRAL DILATATION CATHETER PLACEMENT;  Surgeon: Abbie Sons, MD;  Location: [facility] OR[facility];  Service: Urology;  Laterality: N/A;  ? CYSTOSCOPY WITH URETHRAL DILATATION N/A 03/24/2019  ? Procedure: CYSTOSCOPY WITH URETHRAL DILATATION;  Surgeon: Abbie Sons, MD;  Location: [facility] ORS;  Service: Urology;  Laterality: N/A;  ? PROSTATE SURGERY    ? SIGMOIDOSCOPY    ? TONSILLECTOMY    ? ? ?Home Medications:  ?Allergies as of 08/20/2021   ? ?   Reactions  ? Codeine   ? "makes him agitated" per daughter  ? Morphine And Related Itching  ? Sulphadimidine [sulfamethazine] Itching  ? Trazodone And Nefazodone   ? Keeps pt awake   ? ?  ? ?  ?Medication List  ?  ? ?  ? Accurate as of August 20, 2021  3:10 PM. If you have any questions, ask your nurse or doctor.  ?  ?  ? ?  ? ?aspirin 325 MG tablet ?Take 325 mg by mouth daily. ?  ?atorvastatin 20 MG tablet ?Commonly known as: LIPITOR ?Take 20 mg by mouth daily. ?  ?  atorvastatin 20 MG tablet ?Commonly known as: LIPITOR ?Take 20 mg by mouth daily. ?  ?BIOFREEZE EX ?Apply 1 application. topically daily as needed (muscle pain). ?  ?GENTEAL OP ?Place 1 drop into both eyes 2 (two) times daily. ?  ?ibuprofen 200 MG tablet ?Commonly known as: ADVIL ?Take 200 mg by mouth every 6 (six) hours as needed for mild pain. ?  ?metoprolol succinate 50 MG 24 hr tablet ?Commonly known as: TOPROL-XL ?Take 50 mg by mouth daily. ?  ?Na Sulfate-K Sulfate-Mg Sulf 17.5-3.13-1.6 GM/177ML Soln ?Commonly known as: Suprep Bowel Prep Kit ?Take 1 kit by mouth as directed. ?  ?sodium chloride 0.65 % Soln nasal spray ?Commonly known as: OCEAN ?Place 1 spray into both nostrils as needed  for congestion. ?  ?Vitamin D (Cholecalciferol) 10 MCG (400 UNIT) Caps ?Take 400 Units by mouth daily. ?  ?vitamin E 180 MG (400 UNITS) capsule ?Take 400 Units by mouth daily. ?  ? ?  ? ? ?Allergies:  ?Allergies  ?Allergen Reactions  ? Codeine   ?  "makes him agitated" per daughter  ? Morphine And Related Itching  ? Sulphadimidine [Sulfamethazine] Itching  ? Trazodone And Nefazodone   ?  Keeps pt awake  ?  ? ? ?Family History: ?Family History  ?Problem Relation Age of Onset  ? Congestive Heart Failure Father   ? Rectal cancer Sister   ? Prostate cancer Brother   ? ? ?Social History:  reports that he has never smoked. He has never used smokeless tobacco. He reports that he does not drink alcohol and does not use drugs. ? ?ROS: ?Pertinent ROS in HPI ? ?Physical Exam: ?BP 139/71   Pulse 86   Ht '5\' 11"'  (1.803 m)   Wt 221 lb (100.2 kg)   BMI 30.82 kg/m?   ?Constitutional:  Well nourished. Alert and oriented, No acute distress. ?HEENT: [place] AT, moist mucus membranes.  Trachea midline ?Cardiovascular: No clubbing, cyanosis, or edema. ?Respiratory: Normal respiratory effort, no increased work of breathing. ?GU: No CVA tenderness.  No bladder fullness or masses.  SPT site is clean and dry.  ?Neurologic: Grossly intact, no focal deficits, moving all 4 extremities. ?Psychiatric: Normal mood and affect. ? ?Laboratory Data: ?N/A ? ?Pertinent Imaging ?N/A ? ?Suprapubic Cath Change ? ?Patient is present today for a suprapubic catheter change due to urinary retention.  8 ml of water was drained from the balloon, a 18 FR Silastic foley cath was removed from the tract with out difficulty.  Site was cleaned and prepped in a sterile fashion with betadine.  A 20 FR Silastic foley cath was replaced into the tract no complications were noted. Urine return was noted, 10 ml of sterile water was inflated into the balloon and a plug was placed in the catheter.   ? ?Assessment & Plan:   ? ?1. Encounter for SPT issues ?-Switch to a larger  lumen catheter 40f to 210f? ? ?Return for 40 days for SPT exchange . ? ?These notes generated with voice recognition software. I apologize for typographical errors. ? ?Versa Craton, PA-C ? ?[id number] [phone]  ?

## 2021-08-20 ENCOUNTER — Ambulatory Visit (INDEPENDENT_AMBULATORY_CARE_PROVIDER_SITE_OTHER): Payer: Medicare Other | Admitting: Urology

## 2021-08-20 VITALS — BP 139/71 | HR 86 | Ht 71.0 in | Wt 221.0 lb

## 2021-08-20 DIAGNOSIS — C61 Malignant neoplasm of prostate: Secondary | ICD-10-CM

## 2021-08-20 DIAGNOSIS — R339 Retention of urine, unspecified: Secondary | ICD-10-CM | POA: Diagnosis not present

## 2021-08-20 DIAGNOSIS — T83010A Breakdown (mechanical) of cystostomy catheter, initial encounter: Secondary | ICD-10-CM

## 2021-08-21 ENCOUNTER — Ambulatory Visit
Admission: RE | Admit: 2021-08-21 | Discharge: 2021-08-21 | Disposition: A | Discharge status: Home / Self Care | Payer: Medicare Other | Source: Ambulatory Visit | Attending: Internal Medicine | Admitting: Internal Medicine

## 2021-08-21 DIAGNOSIS — I700 Atherosclerosis of aorta: Secondary | ICD-10-CM | POA: Diagnosis present

## 2021-08-21 DIAGNOSIS — R1900 Intra-abdominal and pelvic swelling, mass and lump, unspecified site: Secondary | ICD-10-CM | POA: Diagnosis present

## 2021-08-21 DIAGNOSIS — I10 Essential (primary) hypertension: Secondary | ICD-10-CM | POA: Insufficient documentation

## 2021-08-29 ENCOUNTER — Other Ambulatory Visit (HOSPITAL_COMMUNITY): Payer: Self-pay | Admitting: Internal Medicine

## 2021-08-29 ENCOUNTER — Other Ambulatory Visit: Payer: Self-pay | Admitting: Internal Medicine

## 2021-08-29 DIAGNOSIS — I7121 Aneurysm of the ascending aorta, without rupture: Secondary | ICD-10-CM

## 2021-08-29 DIAGNOSIS — I700 Atherosclerosis of aorta: Secondary | ICD-10-CM

## 2021-09-10 ENCOUNTER — Ambulatory Visit: Payer: Medicare Other | Admitting: Urology

## 2021-09-30 NOTE — Progress Notes (Signed)
Suprapubic Cath Change ? ?Patient is present today for a suprapubic catheter change due to urinary retention.  8 ml of water was drained from the balloon, a 20 FR Silastic foley cath was removed from the tract with out difficulty.  Site was cleaned and prepped in a sterile fashion with betadine.  A 22 FR foley cath was replaced into the tract no complications were noted. Urine return was noted, 10 ml of sterile water was inflated into the balloon and a plug was placed.  Patient tolerated well.  We are increasing the size of the catheter due to sediment buildup. ? ?He was having some episodes of urgency and dysuria and is wondering if he has a UTI.  Patient denies any modifying or aggravating factors.  Patient denies any gross hematuria or suprapubic/flank pain.  Patient denies any fevers, chills, nausea or vomiting.   ? ?UA nitrate positive, 11-30 WBCs, greater than 30 RBCs many bacteria.  Urine sent for culture.  Ceftin sent to pharmacy.  ? ?Performed by: Zara Council, PA-C  ? ?Follow up: 40 days for SPT exchange   ?

## 2021-10-01 ENCOUNTER — Ambulatory Visit (INDEPENDENT_AMBULATORY_CARE_PROVIDER_SITE_OTHER): Payer: Medicare Other | Admitting: Urology

## 2021-10-01 ENCOUNTER — Encounter: Payer: Self-pay | Admitting: Urology

## 2021-10-01 VITALS — BP 148/65 | HR 78 | Ht 71.0 in | Wt 223.4 lb

## 2021-10-01 DIAGNOSIS — R339 Retention of urine, unspecified: Secondary | ICD-10-CM | POA: Diagnosis not present

## 2021-10-01 DIAGNOSIS — R3915 Urgency of urination: Secondary | ICD-10-CM

## 2021-10-01 DIAGNOSIS — Z436 Encounter for attention to other artificial openings of urinary tract: Secondary | ICD-10-CM | POA: Diagnosis not present

## 2021-10-01 DIAGNOSIS — R350 Frequency of micturition: Secondary | ICD-10-CM | POA: Diagnosis not present

## 2021-10-01 DIAGNOSIS — T83010A Breakdown (mechanical) of cystostomy catheter, initial encounter: Secondary | ICD-10-CM

## 2021-10-01 DIAGNOSIS — R3989 Other symptoms and signs involving the genitourinary system: Secondary | ICD-10-CM

## 2021-10-01 MED ORDER — CEFUROXIME AXETIL 500 MG PO TABS: 500.0000 mg | ORAL_TABLET | Freq: Two times a day (BID) | ORAL | 0 refills | Status: DC

## 2021-10-02 LAB — MICROSCOPIC EXAMINATION: RBC, Urine: >30 /hpf — AB (ref 0–2)

## 2021-10-02 LAB — URINALYSIS, COMPLETE
Bilirubin, UA: NEGATIVE
Glucose, UA: NEGATIVE
Ketones, UA: NEGATIVE
Nitrite, UA: POSITIVE — AB
Specific Gravity, UA: 1.025 (ref 1.005–1.030)
Urobilinogen, Ur: 1 mg/dL (ref 0.2–1.0)
pH, UA: 7 (ref 5.0–7.5)

## 2021-10-04 ENCOUNTER — Telehealth: Payer: Self-pay

## 2021-10-04 DIAGNOSIS — T83010A Breakdown (mechanical) of cystostomy catheter, initial encounter: Secondary | ICD-10-CM

## 2021-10-04 LAB — CULTURE, URINE COMPREHENSIVE

## 2021-10-04 NOTE — Telephone Encounter (Signed)
-----   Message from Nori Riis, PA-C sent at 10/04/2021  8:44 AM EDT ----- Please let [name] know that his urine culture was negative, so I would like for him to have an Xray to check for kidney or bladder stones as he had sediment in his catheter tube.

## 2021-10-04 NOTE — Telephone Encounter (Signed)
OK per DPR, [facility] notifying pt or results. Gave pt instructions on completing KUB, orders placed. Advised pt to call office should he have any questions.

## 2021-10-07 NOTE — Telephone Encounter (Signed)
Pt calls triage line and states that he is returning call. Read result note to patient from provider. Pt voiced understanding, however he states he will decline KUB at this time as he believes the issue is not stone related and he has had a lot of imaging recently. He states he will discuss further with you at his next visit.

## 2021-11-12 ENCOUNTER — Ambulatory Visit (INDEPENDENT_AMBULATORY_CARE_PROVIDER_SITE_OTHER): Payer: Medicare Other | Admitting: Urology

## 2021-11-12 DIAGNOSIS — R339 Retention of urine, unspecified: Secondary | ICD-10-CM | POA: Diagnosis not present

## 2021-11-12 DIAGNOSIS — Z466 Encounter for fitting and adjustment of urinary device: Secondary | ICD-10-CM

## 2021-11-12 DIAGNOSIS — T83010A Breakdown (mechanical) of cystostomy catheter, initial encounter: Secondary | ICD-10-CM

## 2021-11-12 DIAGNOSIS — R3915 Urgency of urination: Secondary | ICD-10-CM

## 2021-11-12 MED ORDER — NITROFURANTOIN MONOHYD MACRO 100 MG PO CAPS: 100.0000 mg | ORAL_CAPSULE | Freq: Two times a day (BID) | ORAL | 1 refills | Status: DC

## 2021-12-24 ENCOUNTER — Ambulatory Visit (INDEPENDENT_AMBULATORY_CARE_PROVIDER_SITE_OTHER): Payer: Medicare Other | Admitting: Physician Assistant

## 2021-12-24 ENCOUNTER — Telehealth: Payer: Self-pay

## 2021-12-24 DIAGNOSIS — R3915 Urgency of urination: Secondary | ICD-10-CM

## 2021-12-24 DIAGNOSIS — R8271 Bacteriuria: Secondary | ICD-10-CM

## 2021-12-24 DIAGNOSIS — Z435 Encounter for attention to cystostomy: Secondary | ICD-10-CM | POA: Diagnosis not present

## 2021-12-24 DIAGNOSIS — R8281 Pyuria: Secondary | ICD-10-CM | POA: Diagnosis not present

## 2021-12-24 DIAGNOSIS — R339 Retention of urine, unspecified: Secondary | ICD-10-CM | POA: Diagnosis not present

## 2021-12-24 DIAGNOSIS — R3129 Other microscopic hematuria: Secondary | ICD-10-CM | POA: Diagnosis not present

## 2021-12-24 DIAGNOSIS — R39198 Other difficulties with micturition: Secondary | ICD-10-CM

## 2021-12-24 DIAGNOSIS — R3989 Other symptoms and signs involving the genitourinary system: Secondary | ICD-10-CM

## 2021-12-24 LAB — MICROSCOPIC EXAMINATION: WBC, UA: >30 /hpf — AB (ref 0–5)

## 2021-12-24 LAB — URINALYSIS, COMPLETE
Bilirubin, UA: NEGATIVE
Glucose, UA: NEGATIVE
Ketones, UA: NEGATIVE
Nitrite, UA: NEGATIVE
Specific Gravity, UA: 1.02 (ref 1.005–1.030)
Urobilinogen, Ur: 1 mg/dL (ref 0.2–1.0)
pH, UA: 6 (ref 5.0–7.5)

## 2021-12-24 NOTE — Progress Notes (Signed)
Suprapubic Cath Change  Patient is present today for a suprapubic catheter change due to urinary retention.  39m of water was drained from the balloon, a 22FR foley cath was removed from the tract with difficulty due to catheter encrustation.  Site was cleaned and prepped in a sterile fashion with betadine.  A 22FR foley cath was replaced into the tract no complications were noted. Urine return was noted, 10 ml of sterile water was inflated into the balloon and catheter was plugged per patient preference.  Patient tolerated well.    Performed by: SDebroah Loop PA-C and CElberta Leatherwood CMA  Additional notes: Patient reports urinary urgency and burning discomfort with bladder drainage. He is concerned for UTI. [place] and he has declined recommended KUB and cystoscopy. Urine sample obtained from new catheter notable for pyuria, microscopic hematuria, and bacteriuria consistent with prior. On physical exam, SP tract is without purulent drainage or surrounding erythema. There was some clear, yellow urine noted draining from around the catheter upon initial exam consistent with bladder spasm.  Will continue suppressive Macrobid and repeat culture today, treating as indicated. If negative, again recommend KUB vs cystoscopy. May also consider beta 3 agonists for bladder spasms.  Follow up: Return in about 6 weeks (around 02/02/2022) for SPT exchange, Will call with results.

## 2021-12-24 NOTE — Telephone Encounter (Signed)
Per Sam: can  you please call him and tell him his UA looks very similar to prior when culture was negative, so I'm sending for culture and will call him with his results? ok to continue macrobid in the interim.  Called pt to let him know. Pt states he is confused as to why Sam is not treating today and he doesn't have anymore Macrobid left. Called [facility] to inquire ab pt's Macrobid Rx and pharmacy states last time it was filled, the sig read take one capsule every 12 hours. 30 capsules, 1 refill. Updated the sig with the pharmacy to once daily and told them to go ahead and fill for pt. Called pt back to update him and advised him someone would call him with his culture results. He expressed understanding.

## 2021-12-25 IMAGING — MR MR ABDOMEN WO/W CM
17 of 22 series · 36 of 48 positions shown · IV contrast (gadavist)
Comparison: CT 08/23/2018

CLINICAL DATA: Evaluation pancreatic cysts.

EXAM:
MRI ABDOMEN WITHOUT AND WITH CONTRAST
TECHNIQUE: Multiplanar multisequence MR imaging of the abdomen was performed
both before and after the administration of intravenous contrast.
CONTRAST:  10mL GADAVIST GADOBUTROL 1 MMOL/ML IV SOLN

[Series 2: cor haste · coronal · 6.0mm · 1.19mm/px · 2 of 36 slices shown]
[im 1/36]
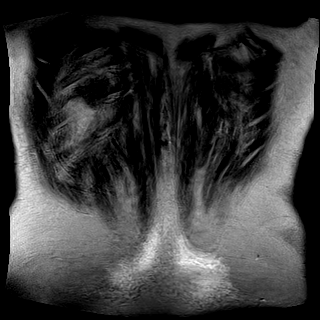
[im 36/36]
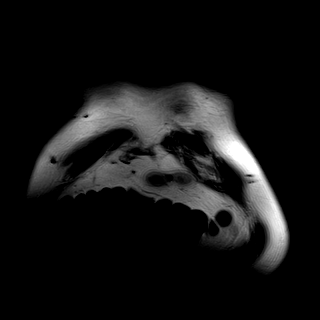

[Series 3: ax haste · axial · 6.5mm · 1.25mm/px · 1 of 32 slices shown]
[im 1/32]
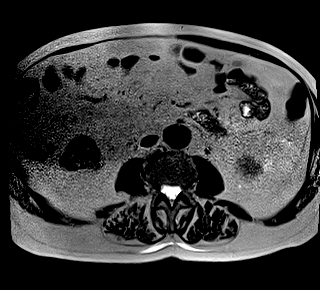

[Series 6: T2 fat-sat · axial · 6.0mm · 1.25mm/px · 1 of 35 slices shown]
[im 1/35]
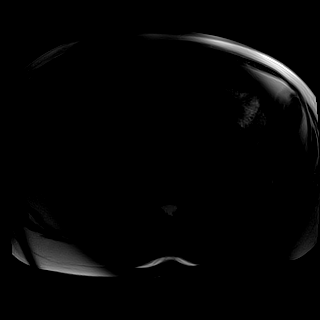

[Series 7: ax dwi_tracew · axial · 6.0mm · 1.49mm/px · z∈[-102,+150]mm · 4 of 108 slices shown]
[im 1/108]
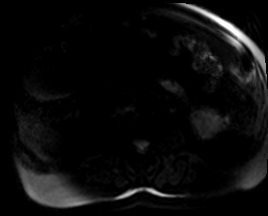
[im 36/108]
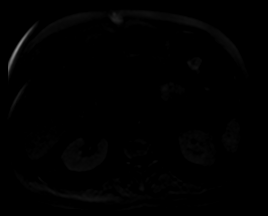
[im 72/108]
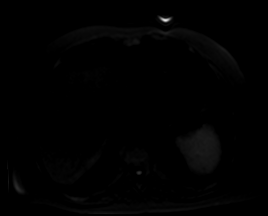
[im 108/108]
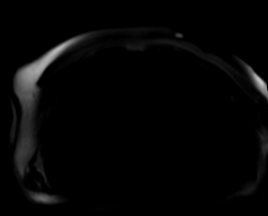

[Series 8: ax dwi_adc · axial · 6.0mm · 1.49mm/px · 1 of 36 slices shown]
[im 1/36]
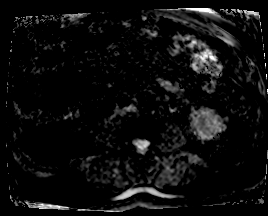

[Series 9: T1 · axial · 6.5mm · 0.78mm/px · 1 of 32 slices shown (1 of 2)]
[im 1/32]
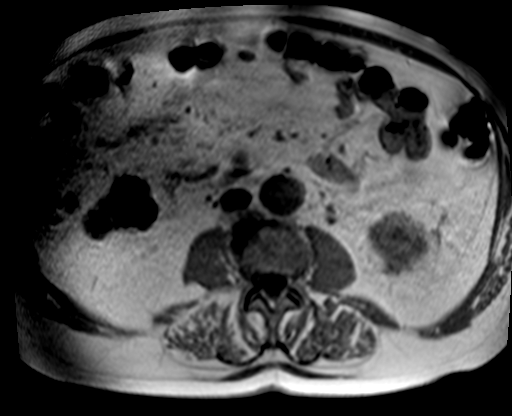

[Series 9: T1 · axial · 6.5mm · 0.78mm/px · 1 of 32 slices shown (2 of 2)]
[im 1/32]
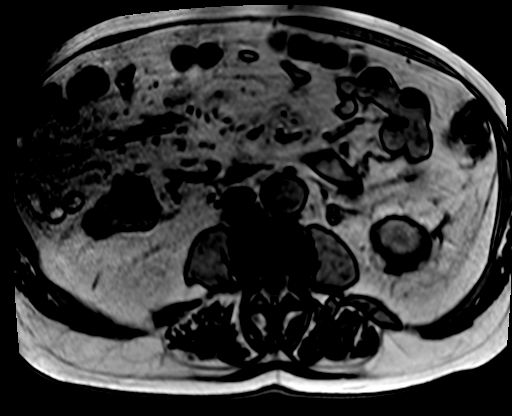

[Series 10: t2_space_cor_cs24_bh_384 · coronal · 1.2mm · 0.49mm/px · 3 of 75 slices shown]
[im 1/75]
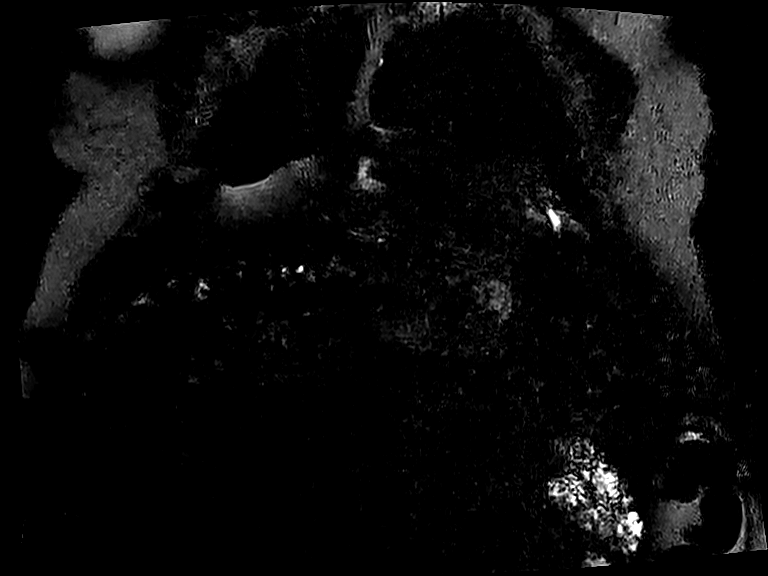
[im 38/75]
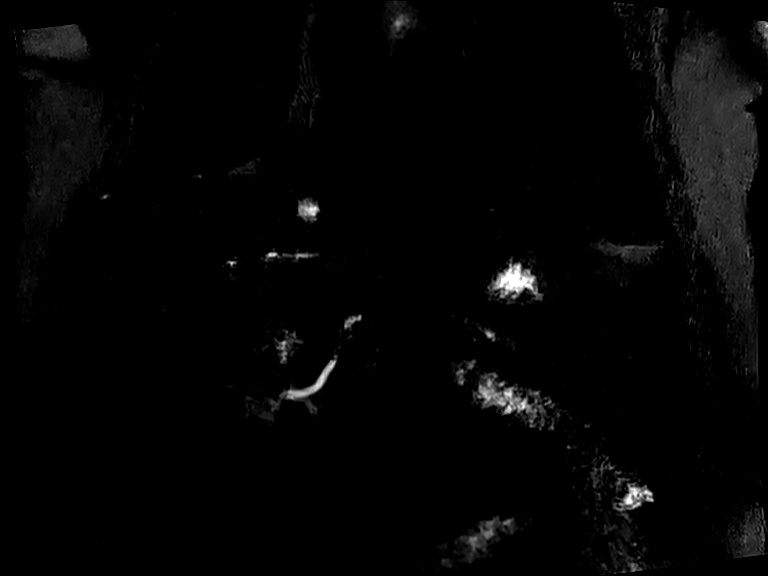
[im 75/75]
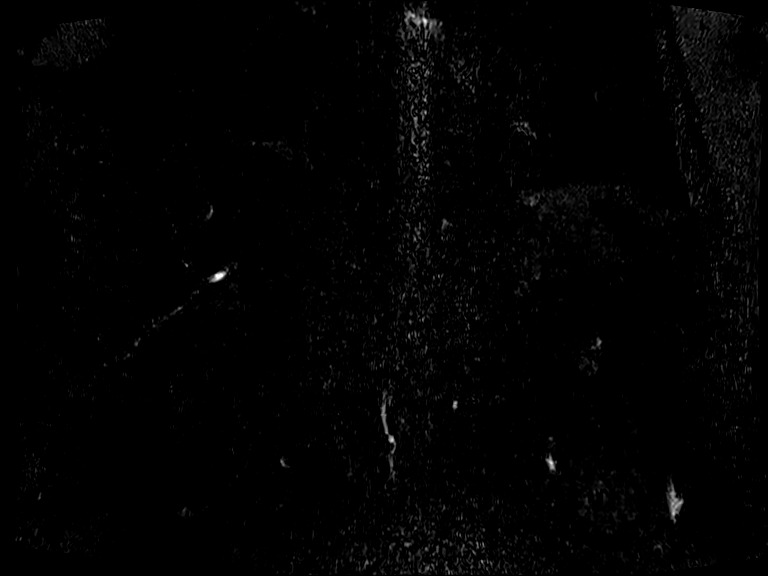

[Series 11: t2_space_cor_cs24_bh_384_mip_radials · sagittal · 0.49mm/px · 1 of 17 slices shown]
[im 1/17]
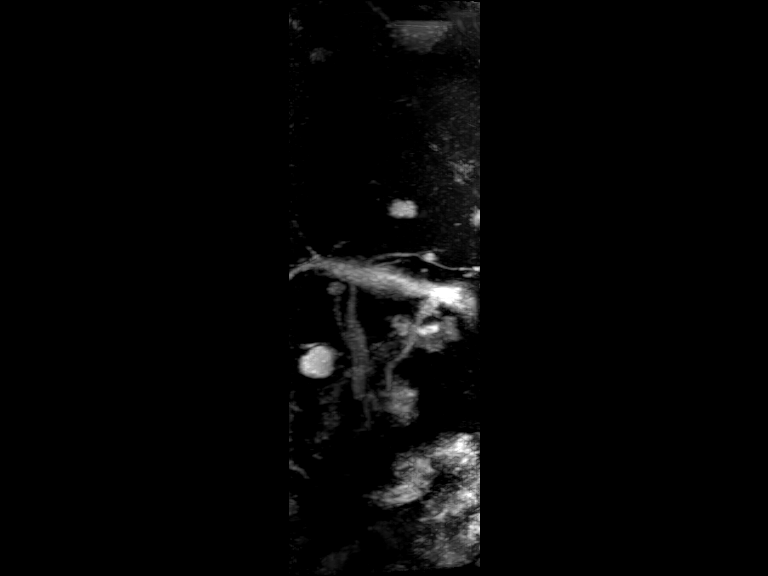

[Series 12: bSSFP · axial · 6.5mm · 0.78mm/px · 1 of 32 slices shown]
[im 1/32]
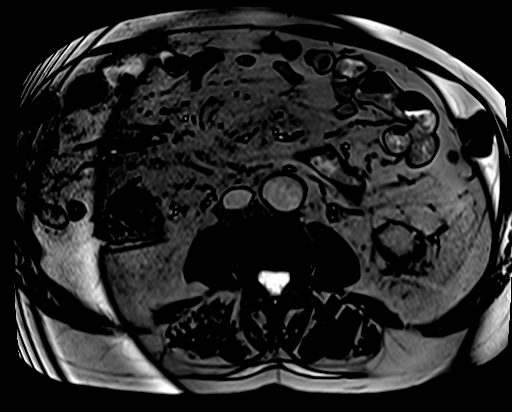

[Series 13: T1 dynamic fat-sat · axial · non-contrast · 3.0mm · 1.25mm/px · z∈[-113,+148]mm · 3 of 88 slices shown (1 of 4)]
[im 1/88]
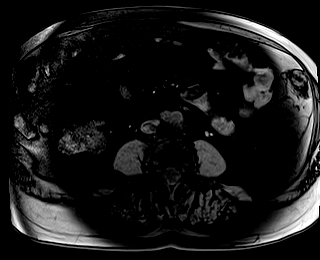
[im 44/88]
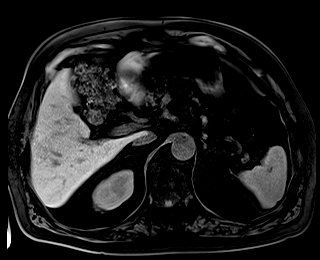
[im 88/88]
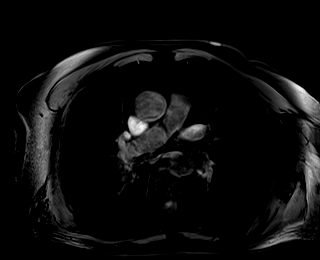

[Series 14: T1 dynamic fat-sat post-contrast · axial · 3.0mm · 1.25mm/px · z∈[-113,+148]mm · 3 of 88 slices shown (1 of 3)]
[im 1/88]
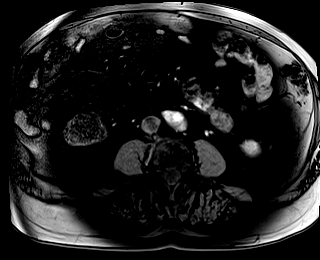
[im 44/88]
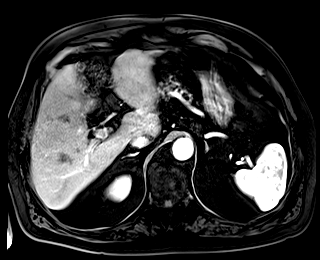
[im 88/88]
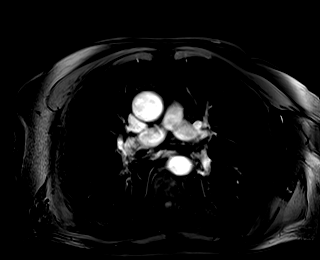

[Series 15: T1 dynamic fat-sat · axial · 3.0mm · 1.25mm/px · z∈[-113,+148]mm · 3 of 88 slices shown (2 of 4)]
[im 1/88]
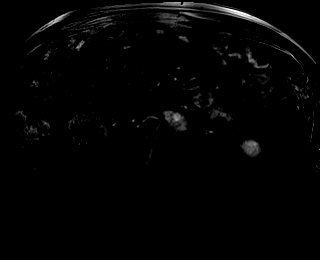
[im 44/88]
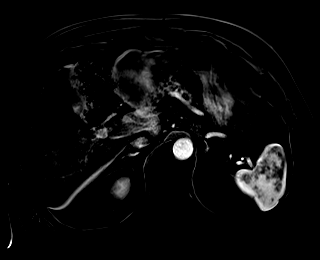
[im 88/88]
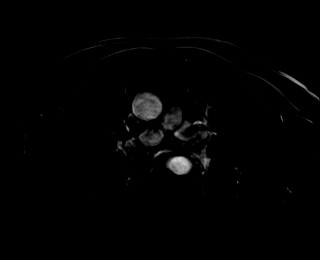

[Series 16: T1 dynamic fat-sat post-contrast · axial · 3.0mm · 1.25mm/px · z∈[-113,+148]mm · 3 of 88 slices shown (2 of 3)]
[im 1/88]
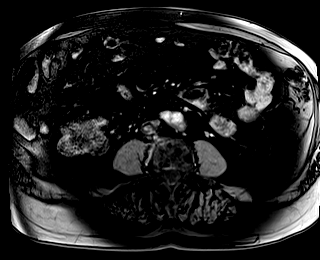
[im 44/88]
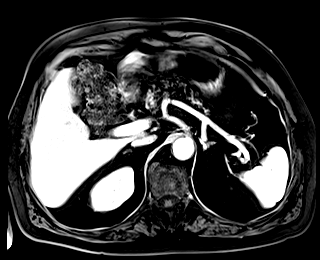
[im 88/88]
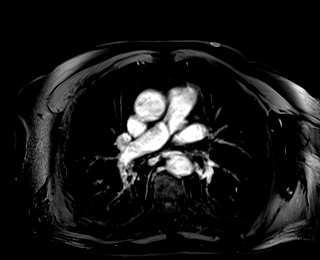

[Series 17: T1 dynamic fat-sat · axial · 3.0mm · 1.25mm/px · z∈[-113,+148]mm · 3 of 88 slices shown (3 of 4)]
[im 1/88]
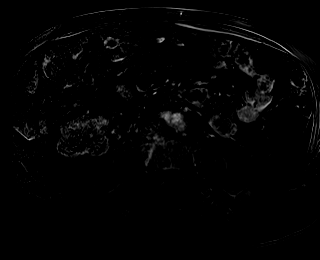
[im 44/88]
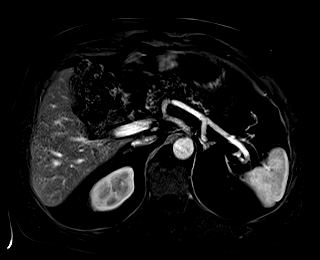
[im 88/88]
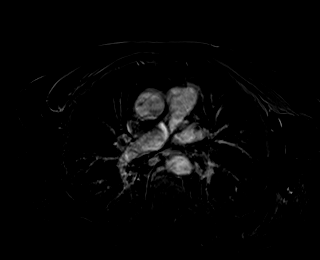

[Series 18: T1 dynamic fat-sat post-contrast · axial · 3.0mm · 1.25mm/px · z∈[-113,+148]mm · 3 of 88 slices shown (3 of 3)]
[im 1/88]
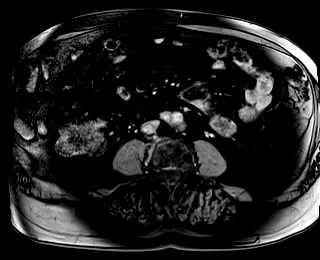
[im 44/88]
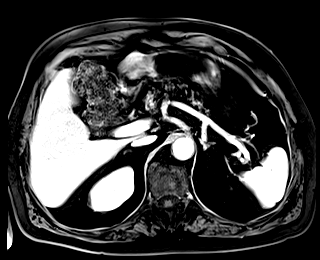
[im 88/88]
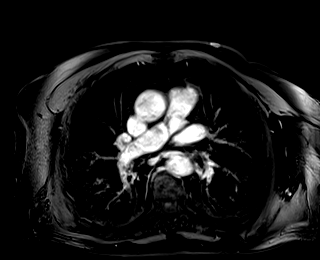

[Series 19: T1 dynamic fat-sat · axial · 3.0mm · 1.25mm/px · z∈[-113,+16]mm · 2 of 88 slices shown (4 of 4)]
[im 1/88]
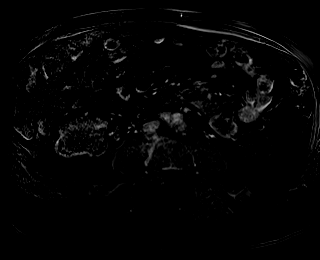
[im 44/88]
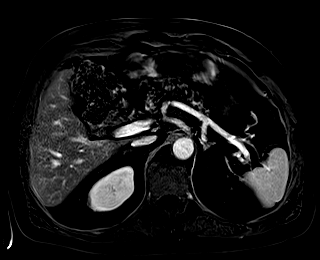

[36 of 48 positions shown; findings below may reference images not displayed]

FINDINGS: Lower chest:  Lung bases are clear.

Hepatobiliary: The several bilateral round cystic lesions LEFT RIGHT
hepatic lobe without post-contrast enhancement consistent with
benign renal cysts. No biliary duct dilatation. Postcholecystectomy

Pancreas: Small cystic lesion in the head of the pancreas measuring
7 mm (image [DATE]. The pancreatic duct is ectatic with side branch
ductal ectasia noted on image [DATE] and [DATE].

The tail the pancreas there is a unilocular cyst measuring 2.1 by
1.6 cm (image [DATE]). There is no pancreatic duct dilatation. No
pancreatic mass lesion identified. Mild atrophy of the pancreas.

Spleen: Normal spleen.

Adrenals/urinary tract: Adrenal glands and kidneys are normal.

Stomach/Bowel: Stomach and limited of the small bowel is
unremarkable

Vascular/Lymphatic: Abdominal aortic normal caliber. No
retroperitoneal periportal lymphadenopathy.

Musculoskeletal: No aggressive osseous lesion
IMPRESSION: 1. Several benign appearing cystsin the pancreas. Mild side branch
ductal ectasia also noted. No discrete ductal dilatation. No
pancreatic mass. Per consensus criteria, recommend follow-up based
on the largest lesion in the tail the pancreas. For patient greater
than greater than 80-years-old, follow-up MRI or CT is recommended
in 2 years.
2. Benign hepatic cysts.

## 2021-12-27 ENCOUNTER — Telehealth: Payer: Self-pay | Admitting: *Deleted

## 2021-12-27 LAB — CULTURE, URINE COMPREHENSIVE

## 2021-12-27 MED ORDER — CEFUROXIME AXETIL 250 MG PO TABS: 250.0000 mg | ORAL_TABLET | Freq: Two times a day (BID) | ORAL | 0 refills | Status: DC

## 2021-12-27 NOTE — Telephone Encounter (Addendum)
Patient informed, sent in Cefuroxime to [facility]. Voiced understanding.   ----- Message from Debroah Loop, PA-C sent at 12/27/2021  3:41 PM EDT ----- Cefuroxime '250mg'$  BID x7 days, start Macrobid '100mg'$  daily suppression the day after completing course.

## 2021-12-27 NOTE — Telephone Encounter (Signed)
Patient called Triage requesting to know urine culture results. He has not started Macrobid due to confusion on SIG. Explained and reviewed in detail. Please advise on culture results.

## 2022-02-03 NOTE — Progress Notes (Unsigned)
Suprapubic Cath Change  Patient is present today for a suprapubic catheter change due to urinary retention.  8 ml of water was drained from the balloon, a 22 FR foley cath was removed from the tract with out difficulty.  Site was cleaned and prepped in a sterile fashion with betadine.  A 20 FR foley cath was replaced into the tract no complications were noted. Urine return was noted, 10 ml of sterile water was inflated into the balloon and a plug attached for drainage.  Patient tolerated well. A night bag was given to patient and proper instruction was given on how to switch bags.    Performed by: Zara Council, PA-C   Follow up: SPT exchange in 40 days.  Continue Macrobid suppression.

## 2022-02-04 ENCOUNTER — Ambulatory Visit (INDEPENDENT_AMBULATORY_CARE_PROVIDER_SITE_OTHER): Payer: Medicare Other | Admitting: Urology

## 2022-02-04 DIAGNOSIS — R339 Retention of urine, unspecified: Secondary | ICD-10-CM

## 2022-02-04 DIAGNOSIS — T83010A Breakdown (mechanical) of cystostomy catheter, initial encounter: Secondary | ICD-10-CM

## 2022-02-04 DIAGNOSIS — N390 Urinary tract infection, site not specified: Secondary | ICD-10-CM

## 2022-02-04 MED ORDER — NITROFURANTOIN MONOHYD MACRO 100 MG PO CAPS: 100.0000 mg | ORAL_CAPSULE | Freq: Two times a day (BID) | ORAL | 0 refills | Status: DC

## 2022-02-05 ENCOUNTER — Telehealth: Payer: Self-pay | Admitting: Urology

## 2022-02-05 NOTE — Telephone Encounter (Signed)
Is he referring to the [place]?  If so, its not ever a good idea to take antibiotics long-term.  That is why it is important to get the x-ray to see if there is a stone present. ....    Notified patient as instructed,

## 2022-02-05 NOTE — Telephone Encounter (Signed)
Pt wants to know if the medication prescribed is ok to take long term.  Pt would like a call back from [place].  Thank you

## 2022-02-09 NOTE — Progress Notes (Signed)
Virtual Visit via Telephone Note  I connected with Brady Hanna on 02/09/22 at  3:00 PM EDT by telephone and verified that I am speaking with the correct person using two identifiers.  Location: Patient: Home Provider: Office   I discussed the limitations, risks, security and privacy concerns of performing an evaluation and management service by telephone and the availability of in person appointments. I also discussed with the patient that there may be a patient responsible charge related to this service. The patient expressed understanding and agreed to proceed.   History of Present Illness: Urological history: 1. Urethral stricture -s/p DVIU 2016 -urethral dilation 2020 x 2 -managed with a SPT with a plug   2. Phimosis -circumcision 2016   3. Prostate cancer -prostatectomy 1994   He has some questions regarding his medications.  He had been taking the nitrofurantoin 100 mg BID.  He has not had his KUB.     Observations/Objective: He was answering questions appropriately and did not sound distressed.  He was confused regarding his Macrobid prescription.  He was listed as take twice daily for 90 days in the epic system, so I have rectified that and sent it in for 1 a day dosing.  I advised him he only needs to take the nitrofurantoin 100 mg once daily.  Assessment and Plan: 1. rUTI's -I explained to him that he will continue to get urinary tract infections as he likely has a sanctuary site for the UTIs and that we would like to get a KUB to evaluate for possible bladder calculi -continue suppressive Macrobid -encouraged him to get KUB  Follow Up Instructions:  He will follow up as scheduled for SPT exchange.    I discussed the assessment and treatment plan with the patient. The patient was provided an opportunity to ask questions and all were answered. The patient agreed with the plan and demonstrated an understanding of the instructions.   The patient was advised to call  back or seek an in-person evaluation if the symptoms worsen or if the condition fails to improve as anticipated.  I provided 10 minutes of non-face-to-face time during this encounter.   Elaysha Bevard, PA-C

## 2022-02-10 ENCOUNTER — Encounter: Payer: Self-pay | Admitting: Urology

## 2022-02-10 ENCOUNTER — Ambulatory Visit (INDEPENDENT_AMBULATORY_CARE_PROVIDER_SITE_OTHER): Payer: Medicare Other | Admitting: Urology

## 2022-02-10 DIAGNOSIS — R3915 Urgency of urination: Secondary | ICD-10-CM | POA: Diagnosis not present

## 2022-02-10 DIAGNOSIS — N390 Urinary tract infection, site not specified: Secondary | ICD-10-CM

## 2022-02-10 DIAGNOSIS — R300 Dysuria: Secondary | ICD-10-CM | POA: Diagnosis not present

## 2022-02-10 DIAGNOSIS — Z87440 Personal history of urinary (tract) infections: Secondary | ICD-10-CM

## 2022-02-10 DIAGNOSIS — T83010A Breakdown (mechanical) of cystostomy catheter, initial encounter: Secondary | ICD-10-CM

## 2022-02-10 MED ORDER — NITROFURANTOIN MONOHYD MACRO 100 MG PO CAPS: ORAL_CAPSULE | ORAL | 0 refills | Status: DC

## 2022-02-19 ENCOUNTER — Ambulatory Visit
Admission: RE | Admit: 2022-02-19 | Discharge: 2022-02-19 | Disposition: A | Discharge status: Home / Self Care | Payer: Medicare Other | Attending: Urology | Admitting: Urology

## 2022-02-19 ENCOUNTER — Ambulatory Visit
Admission: RE | Admit: 2022-02-19 | Discharge: 2022-02-19 | Disposition: A | Discharge status: Home / Self Care | Payer: Medicare Other | Source: Ambulatory Visit | Attending: Urology | Admitting: Urology

## 2022-02-19 DIAGNOSIS — N390 Urinary tract infection, site not specified: Secondary | ICD-10-CM

## 2022-02-21 ENCOUNTER — Telehealth: Payer: Self-pay | Admitting: *Deleted

## 2022-02-21 NOTE — Telephone Encounter (Signed)
Notified patient as instructed, patient pleased. Discussed follow-up appointments, patient agrees  

## 2022-02-21 NOTE — Telephone Encounter (Signed)
-----   Message from Nori Riis, PA-C sent at 02/21/2022  9:41 AM EDT ----- Please let Brady Hanna know that he has a lot of stool in his colon and that may be contributing to his recurrent UTI's.  We also need to get him scheduled for a cysto w/ Dr. Bernardo Heater.

## 2022-02-26 ENCOUNTER — Telehealth: Payer: Self-pay

## 2022-02-26 NOTE — Telephone Encounter (Signed)
Pts daughter Meredith Mody called requesting u/s results- Verified Meredith Mody is on [place].  Gwen informed -large amount of stool in colon that may be contributing to his recurrent utis.   Gwen aware of  next appt.  Gwen asked about changing pts ATB. Informed Gwen that pt contacted office yesterday to change ATB. Per [facility] [place] pt needed to be seen. Offered pt appt however he declined.   Gwen voiced understanding. She will encourage pt to make an appt. She was very appreciative of call.   CM

## 2022-03-17 NOTE — Progress Notes (Unsigned)
Suprapubic Cath Change  Patient is present today for a suprapubic catheter change due to urinary retention.  9 ml of water was drained from the balloon, a 22 FR foley cath was removed from the tract with out difficulty.  Site was cleaned and prepped in a sterile fashion with betadine.  A 22 FR foley cath was replaced into the tract no complications were noted. Urine return was noted, 10 ml of sterile water was inflated into the balloon and a leg bag was attached for drainage.  Patient tolerated well.   Performed by: Michiel Cowboy, PA-C   Follow up: Brady Hanna is frustrated that I will not continue to prescribe antibiotics for his suprapubic pain.  He states he has been having 85 days of misery.  He stated he has been taking the Macrobid 100 mg daily and he does not understand why he is taking a preventative medication when he has an infection.  He has had to irrigate the catheter because it is getting clogged with debris.  He is having suprapubic discomfort.  I explained to him that we need to run more tests to see why he continues to have the symptoms and why his urine continues to look infected.  He has been very hesitant to pursue additional test as it took him about 2 months to finally acquiesced to get the KUB after it was suggested.  It is also taking him longer to agree to undergo the cystoscopy in 2 weeks.  He continues to feel that we have not treated his infections and that we just give him something to prevent infection and this is not sufficient.  I did show him that his last urine culture in August was treated with 7 days of appropriate antibiotic and then he returned to taking the 100 mg nitrofurantoin.  I did also remind him his symptoms did not clear with either the antibiotic treatment and with the preventative nitrofurantoin and that is why we wanted to pursue KUB and cystoscopy.  He continues to be frustrated and he may seek a second opinion, but I hope that he will present for his  cystoscopy in 2 weeks.  I also encouraged him to keep the catheter to gravity dependent drainage as plugging the catheter at this time is not safe.  I also obtained urine from the newly placed catheter which we will send for urinalysis and urine culture in preparation for his upcoming cystoscopy.  I spent 20 minutes on the day of the encounter to include pre-visit record review, face-to-face time with the patient, and post-visit ordering of tests.

## 2022-03-18 ENCOUNTER — Ambulatory Visit (INDEPENDENT_AMBULATORY_CARE_PROVIDER_SITE_OTHER): Payer: Medicare Other | Admitting: Urology

## 2022-03-18 DIAGNOSIS — R109 Unspecified abdominal pain: Secondary | ICD-10-CM | POA: Diagnosis not present

## 2022-03-18 DIAGNOSIS — T83010A Breakdown (mechanical) of cystostomy catheter, initial encounter: Secondary | ICD-10-CM

## 2022-03-18 DIAGNOSIS — R3989 Other symptoms and signs involving the genitourinary system: Secondary | ICD-10-CM

## 2022-03-18 DIAGNOSIS — R339 Retention of urine, unspecified: Secondary | ICD-10-CM

## 2022-03-18 LAB — URINALYSIS, COMPLETE
Bilirubin, UA: NEGATIVE
Glucose, UA: NEGATIVE
Ketones, UA: NEGATIVE
Nitrite, UA: NEGATIVE
Specific Gravity, UA: 1.025 (ref 1.005–1.030)
Urobilinogen, Ur: 0.2 mg/dL (ref 0.2–1.0)
pH, UA: 7 (ref 5.0–7.5)

## 2022-03-18 LAB — MICROSCOPIC EXAMINATION: WBC, UA: >30 /hpf — AB (ref 0–5)

## 2022-03-23 LAB — CULTURE, URINE COMPREHENSIVE

## 2022-03-24 ENCOUNTER — Telehealth: Payer: Self-pay | Admitting: Family Medicine

## 2022-03-24 MED ORDER — CIPROFLOXACIN HCL 250 MG PO TABS: 250.0000 mg | ORAL_TABLET | Freq: Two times a day (BID) | ORAL | 0 refills | Status: DC

## 2022-03-24 NOTE — Telephone Encounter (Signed)
LMOM informed patient of results and ABX was sent to pharmacy.

## 2022-03-24 NOTE — Telephone Encounter (Signed)
-----   Message from Nori Riis, PA-C sent at 03/24/2022  9:19 AM EST ----- Please let Mr. Richens know that his urine culture was positive for infection and we need for him to start Cipro 250 mg twice daily for ten days.

## 2022-04-02 ENCOUNTER — Other Ambulatory Visit: Payer: Medicare Other | Admitting: Urology

## 2022-04-24 NOTE — Progress Notes (Signed)
Suprapubic Cath Change  Patient is present today for a suprapubic catheter change due to urinary retention.  8 ml of water was drained from the balloon, a 22 FR foley cath was removed from the tract with out difficulty.  Site was cleaned and prepped in a sterile fashion with betadine.  A 22 FR foley cath was replaced into the tract no complications were noted. Urine return was noted, 10 ml of sterile water was inflated into the balloon and a plug  was attached.  Patient tolerated well. A night bag was given to patient and proper instruction was given on how to switch bags.    Performed by: Zara Council, PA-C   Follow up: 40 days for SPT exchange

## 2022-04-25 ENCOUNTER — Ambulatory Visit (INDEPENDENT_AMBULATORY_CARE_PROVIDER_SITE_OTHER): Payer: Medicare Other | Admitting: Urology

## 2022-04-25 VITALS — BP 180/84 | HR 83 | Wt 220.0 lb

## 2022-04-25 DIAGNOSIS — R339 Retention of urine, unspecified: Secondary | ICD-10-CM | POA: Diagnosis not present

## 2022-04-25 DIAGNOSIS — T83010A Breakdown (mechanical) of cystostomy catheter, initial encounter: Secondary | ICD-10-CM

## 2022-06-04 ENCOUNTER — Encounter: Payer: Medicare Other | Admitting: Urology

## 2022-06-04 NOTE — Progress Notes (Signed)
Suprapubic Cath Change  Patient is present today for a suprapubic catheter change due to urinary retention.  8 ml of water was drained from the balloon, a 24 FR foley cath was removed from the tract with out difficulty.  Site was cleaned and prepped in a sterile fashion with betadine.  A 24 FR foley cath was replaced into the tract no complications were noted. Urine return was noted, 10 ml of sterile water was inflated into the balloon and a plug was attached.  Patient tolerated well.    Performed by: Zara Council, PA-C   Follow up: 40 weeks for SPT exchange

## 2022-06-05 ENCOUNTER — Ambulatory Visit (INDEPENDENT_AMBULATORY_CARE_PROVIDER_SITE_OTHER): Payer: Medicare Other | Admitting: Urology

## 2022-06-05 ENCOUNTER — Encounter: Payer: Self-pay | Admitting: Urology

## 2022-06-05 VITALS — BP 170/93 | HR 97 | Ht 71.0 in | Wt 223.0 lb

## 2022-06-05 DIAGNOSIS — R339 Retention of urine, unspecified: Secondary | ICD-10-CM

## 2022-06-05 DIAGNOSIS — T83010A Breakdown (mechanical) of cystostomy catheter, initial encounter: Secondary | ICD-10-CM

## 2022-07-14 NOTE — Progress Notes (Unsigned)
Suprapubic Cath Change  Patient is present today for a suprapubic catheter change due to urinary retention.  9 ml of water was drained from the balloon, a 24 FR foley cath was removed from the tract with out difficulty.  Site was cleaned and prepped in a sterile fashion with betadine.  A 24 FR Silastic foley cath was replaced into the tract no complications were noted. Urine return was noted, 10 ml of sterile water was inflated into the balloon and a plug was attached.  Patient tolerated well.    Performed by: Zara Council, PA-C   Follow up: Follow up in 40 days for SPT exchange

## 2022-07-15 ENCOUNTER — Ambulatory Visit (INDEPENDENT_AMBULATORY_CARE_PROVIDER_SITE_OTHER): Payer: Medicare Other | Admitting: Urology

## 2022-07-15 ENCOUNTER — Encounter: Payer: Self-pay | Admitting: Urology

## 2022-07-15 VITALS — BP 150/77 | HR 89 | Ht 71.0 in | Wt 223.0 lb

## 2022-07-15 DIAGNOSIS — R339 Retention of urine, unspecified: Secondary | ICD-10-CM | POA: Diagnosis not present

## 2022-07-15 DIAGNOSIS — T83010A Breakdown (mechanical) of cystostomy catheter, initial encounter: Secondary | ICD-10-CM

## 2022-07-15 NOTE — Patient Instructions (Addendum)
Your catheter size is a 24 french.   Follow up as scheduled.

## 2022-08-20 NOTE — Progress Notes (Unsigned)
Suprapubic Cath Change  Patient is present today for a suprapubic catheter change due to urinary retention.  9 ml of water was drained from the balloon, a 24 FR Silastic foley cath was removed from the tract with out difficulty.  Site was cleaned and prepped in a sterile fashion with betadine.  A 24 FR foley cath was replaced into the tract {dnt complications:20057}. Urine return was noted, 10 ml of sterile water was inflated into the balloon and a *** bag was attached for drainage.  Patient tolerated well. A night bag was given to patient and proper instruction was given on how to switch bags.    Performed by: ***  Follow up: ***

## 2022-08-21 ENCOUNTER — Encounter: Payer: Self-pay | Admitting: Urology

## 2022-08-21 ENCOUNTER — Ambulatory Visit (INDEPENDENT_AMBULATORY_CARE_PROVIDER_SITE_OTHER): Payer: Medicare Other | Admitting: Urology

## 2022-08-21 VITALS — BP 152/70 | HR 44 | Ht 72.0 in | Wt 222.3 lb

## 2022-08-21 DIAGNOSIS — R339 Retention of urine, unspecified: Secondary | ICD-10-CM

## 2022-08-21 DIAGNOSIS — T83010A Breakdown (mechanical) of cystostomy catheter, initial encounter: Secondary | ICD-10-CM

## 2022-08-22 ENCOUNTER — Ambulatory Visit
Admission: RE | Admit: 2022-08-22 | Discharge: 2022-08-22 | Disposition: A | Discharge status: Home / Self Care | Payer: Medicare Other | Source: Ambulatory Visit | Attending: Internal Medicine | Admitting: Internal Medicine

## 2022-08-22 DIAGNOSIS — I7121 Aneurysm of the ascending aorta, without rupture: Secondary | ICD-10-CM | POA: Diagnosis present

## 2022-08-22 DIAGNOSIS — I700 Atherosclerosis of aorta: Secondary | ICD-10-CM | POA: Insufficient documentation

## 2022-08-22 MED ORDER — IOHEXOL 350 MG/ML SOLN
75.0000 mL | Freq: Once | INTRAVENOUS | Status: AC | PRN
  Administered 2022-08-22: 75 mL via INTRAVENOUS

## 2022-08-25 ENCOUNTER — Telehealth: Payer: Self-pay | Admitting: *Deleted

## 2022-08-25 NOTE — Telephone Encounter (Signed)
Pt in 08/21/22 for cath change and a 65fr silastic was put in instead of 3fr silastic, pt requesting to come back in tomorrow at 1:30 to have 24 put back in due to leakage. Please advise, appt scheduled

## 2022-08-25 NOTE — Progress Notes (Unsigned)
Suprapubic Cath Change  Patient is present today for a suprapubic catheter change due to urinary retention.  9 ml of water was drained from the balloon, a 20 FR foley cath was removed from the tract with out difficulty.  Site was cleaned and prepped in a sterile fashion with betadine.  A 24 FR Silastic foley cath was replaced into the tract no complications were noted. Urine return was noted, 10 ml of sterile water was inflated into the balloon and a plug.  Patient tolerated well.   Performed by: Michiel Cowboy, PA-C and Emi Belfast, CMA  Follow up: 40 days for SPT exchange

## 2022-08-26 ENCOUNTER — Ambulatory Visit (INDEPENDENT_AMBULATORY_CARE_PROVIDER_SITE_OTHER): Payer: Medicare Other | Admitting: Urology

## 2022-08-26 VITALS — BP 179/72 | HR 76 | Ht 72.0 in | Wt 220.0 lb

## 2022-08-26 DIAGNOSIS — R339 Retention of urine, unspecified: Secondary | ICD-10-CM

## 2022-08-26 DIAGNOSIS — T83010A Breakdown (mechanical) of cystostomy catheter, initial encounter: Secondary | ICD-10-CM

## 2022-09-29 NOTE — Progress Notes (Unsigned)
Suprapubic Cath Change  Patient is present today for a suprapubic catheter change due to urinary retention.  9 ml of water was drained from the balloon, a 24 FR Silastic foley cath was removed from the tract with out difficulty.  Site was cleaned and prepped in a sterile fashion with betadine.  A 24 FR Silastic foley cath was replaced into the tract {dnt complications:20057}. Urine return was noted, 10 ml of sterile water was inflated into the balloon and a *** bag was attached for drainage.  Patient tolerated well. A night bag was given to patient and proper instruction was given on how to switch bags.    Performed by: ***  Follow up: 40 days for SPT exchange

## 2022-09-30 ENCOUNTER — Telehealth: Payer: Self-pay | Admitting: Urology

## 2022-09-30 ENCOUNTER — Ambulatory Visit: Payer: Medicare Other | Admitting: Urology

## 2022-09-30 DIAGNOSIS — T83010A Breakdown (mechanical) of cystostomy catheter, initial encounter: Secondary | ICD-10-CM

## 2022-09-30 DIAGNOSIS — R339 Retention of urine, unspecified: Secondary | ICD-10-CM | POA: Diagnosis not present

## 2022-09-30 NOTE — Telephone Encounter (Signed)
Pt is having monthly spt tube changes.  He said his insurance gets billed something different every month.  Can you look into this and give pt a call?

## 2022-09-30 NOTE — Telephone Encounter (Signed)
I s/w pt and informed him we had been charging $355 since 05/2022.  Pt wanted to know why when he started having SPT tube changes that it was $175 and why it had gone up so much.  I gave pt phone number to billing.

## 2022-11-04 ENCOUNTER — Ambulatory Visit (INDEPENDENT_AMBULATORY_CARE_PROVIDER_SITE_OTHER): Payer: Medicare Other | Admitting: Physician Assistant

## 2022-11-04 DIAGNOSIS — R339 Retention of urine, unspecified: Secondary | ICD-10-CM | POA: Diagnosis not present

## 2022-11-04 DIAGNOSIS — Z9359 Other cystostomy status: Secondary | ICD-10-CM | POA: Diagnosis not present

## 2022-11-04 NOTE — Progress Notes (Signed)
Suprapubic Cath Change  Patient is present today for a suprapubic catheter change due to urinary retention.  8ml of water was drained from the balloon, a 24FR Silastic foley cath was removed from the tract without difficulty.  Site was cleaned and prepped in a sterile fashion with betadine and site was lubricated for removal of the old catheter.  A 24FR Silastic foley cath was replaced into the tract no complications were noted. Urine return was noted, 10 ml of sterile water was inflated into the balloon and catheter was plugged.  Patient tolerated well.     Performed by: Carman Ching, PA-C   Follow up: Return in about 6 weeks (around 12/14/2022) for SPT exchange.

## 2022-12-15 ENCOUNTER — Ambulatory Visit: Payer: Medicare Other | Admitting: Physician Assistant

## 2022-12-15 DIAGNOSIS — R339 Retention of urine, unspecified: Secondary | ICD-10-CM

## 2022-12-15 DIAGNOSIS — Z435 Encounter for attention to cystostomy: Secondary | ICD-10-CM

## 2022-12-15 NOTE — Progress Notes (Signed)
Suprapubic Cath Change  Patient is present today for a suprapubic catheter change due to urinary retention.  8ml of water was drained from the balloon, a 24FR Silastic foley cath was removed from the tract without difficulty.  Site was cleaned and prepped in a sterile fashion with betadine and site was lubricated for removal of the old catheter.  A 24FR Silastic foley cath was replaced into the tract no complications were noted. Urine return was noted, 10 ml of sterile water was inflated into the balloon and catheter was plugged.  Patient tolerated well.     Performed by: Carman Ching, PA-C   Follow up: Return in about 6 weeks (around 01/26/2023) for SPT exchange.

## 2023-01-26 ENCOUNTER — Ambulatory Visit: Payer: Medicare Other | Admitting: Physician Assistant

## 2023-01-26 VITALS — BP 136/62 | Ht 72.0 in | Wt 219.3 lb

## 2023-01-26 DIAGNOSIS — R339 Retention of urine, unspecified: Secondary | ICD-10-CM | POA: Diagnosis not present

## 2023-01-26 DIAGNOSIS — Z435 Encounter for attention to cystostomy: Secondary | ICD-10-CM

## 2023-01-26 NOTE — Progress Notes (Signed)
Suprapubic Cath Change  Patient is present today for a suprapubic catheter change due to urinary retention.  8ml of water was drained from the balloon, a 24FR foley cath was removed from the tract with out difficulty.  Site was cleaned and prepped in a sterile fashion with betadine.  A 24FR foley cath was replaced into the tract no complications were noted. Urine return was noted, 10 ml of sterile water was inflated into the balloon and catheter was plugged.  Patient tolerated well.     Performed by: Carman Ching, PA-C   Follow up: Return in about 6 weeks (around 03/09/2023) for SPT exchange.

## 2023-03-09 ENCOUNTER — Ambulatory Visit (INDEPENDENT_AMBULATORY_CARE_PROVIDER_SITE_OTHER): Payer: Medicare Other | Admitting: Physician Assistant

## 2023-03-09 DIAGNOSIS — R339 Retention of urine, unspecified: Secondary | ICD-10-CM | POA: Diagnosis not present

## 2023-03-09 DIAGNOSIS — Z435 Encounter for attention to cystostomy: Secondary | ICD-10-CM

## 2023-03-09 NOTE — Progress Notes (Signed)
Suprapubic Cath Change  Patient is present today for a suprapubic catheter change due to urinary retention.  8ml of water was drained from the balloon, a 24FR Silastic foley cath was removed from the tract without difficulty.  Site was cleaned and prepped in a sterile fashion with betadine.  A 24FR Silastic foley cath was replaced into the tract no complications were noted. Urine return was noted, 10 ml of sterile water was inflated into the balloon and catheter was plugged.  Patient tolerated well.     Performed by: Carman Ching, PA-C and Ples Specter, CMA  Follow up: Return in about 6 weeks (around 04/20/2023) for SPT exchange.

## 2023-04-20 ENCOUNTER — Ambulatory Visit: Payer: Medicare Other | Admitting: Physician Assistant

## 2023-04-20 DIAGNOSIS — Z435 Encounter for attention to cystostomy: Secondary | ICD-10-CM

## 2023-04-20 DIAGNOSIS — R339 Retention of urine, unspecified: Secondary | ICD-10-CM

## 2023-04-20 NOTE — Progress Notes (Signed)
Suprapubic Cath Change  Patient is present today for a suprapubic catheter change due to urinary retention.  8ml of water was drained from the balloon, a 24FR Silastic foley cath was removed from the tract without difficulty.  Site was cleaned and prepped in a sterile fashion with betadine.  A 24FR Silastic foley cath was replaced into the tract no complications were noted. Urine return was noted, 10 ml of sterile water was inflated into the balloon and catheter was plugged.  Patient tolerated well.     Performed by: Carman Ching, PA-C   Follow up: Return in about 6 weeks (around 06/01/2023) for SPT exchange.

## 2023-06-01 ENCOUNTER — Ambulatory Visit: Payer: Medicare Other | Admitting: Physician Assistant

## 2023-06-01 DIAGNOSIS — Z435 Encounter for attention to cystostomy: Secondary | ICD-10-CM

## 2023-06-01 DIAGNOSIS — R339 Retention of urine, unspecified: Secondary | ICD-10-CM | POA: Diagnosis not present

## 2023-06-01 NOTE — Progress Notes (Signed)
 Suprapubic Cath Change  Patient is present today for a suprapubic catheter change due to urinary retention.  7ml of water  was drained from the balloon, a 24FR Silastic foley cath was removed from the tract without difficulty.  Site was cleaned and prepped in a sterile fashion with betadine.  A 24FR Silastic foley cath was replaced into the tract no complications were noted. Urine return was noted, 10 ml of sterile water  was inflated into the balloon and catheter was plugged.  Patient tolerated well.     Performed by: Beatris Belen, PA-C   Follow up: Return in about 6 weeks (around 07/13/2023) for SPT exchange.

## 2023-07-13 ENCOUNTER — Ambulatory Visit: Payer: Self-pay | Admitting: Physician Assistant

## 2023-07-27 ENCOUNTER — Ambulatory Visit: Payer: Medicare Other | Admitting: Physician Assistant

## 2023-07-27 VITALS — BP 142/77 | HR 101 | Ht 72.0 in | Wt 209.0 lb

## 2023-07-27 DIAGNOSIS — Z435 Encounter for attention to cystostomy: Secondary | ICD-10-CM

## 2023-07-27 DIAGNOSIS — R339 Retention of urine, unspecified: Secondary | ICD-10-CM | POA: Diagnosis not present

## 2023-07-27 NOTE — Progress Notes (Signed)
 Suprapubic Cath Change  Patient is present today for a suprapubic catheter change due to urinary retention.  8ml of water was drained from the balloon, a 24FR Silastic foley cath was removed from the tract with out difficulty.  Site was cleaned and prepped in a sterile fashion with betadine.  A 24FR Silastic foley cath was replaced into the tract no complications were noted. 10 ml of sterile water was inflated into the balloon and the catheter was plugged.  Patient tolerated well.   Performed by: Carman Ching, PA-C   Follow up: Return in about 6 weeks (around 09/07/2023) for SPT exchange.

## 2023-07-29 ENCOUNTER — Other Ambulatory Visit: Payer: Self-pay | Admitting: Internal Medicine

## 2023-07-29 DIAGNOSIS — I7121 Aneurysm of the ascending aorta, without rupture: Secondary | ICD-10-CM

## 2023-08-24 ENCOUNTER — Ambulatory Visit
Admission: RE | Admit: 2023-08-24 | Discharge: 2023-08-24 | Disposition: A | Discharge status: Home / Self Care | Source: Ambulatory Visit | Attending: Internal Medicine | Admitting: Internal Medicine

## 2023-08-24 DIAGNOSIS — I7121 Aneurysm of the ascending aorta, without rupture: Secondary | ICD-10-CM | POA: Insufficient documentation

## 2023-08-24 MED ORDER — IOHEXOL 350 MG/ML SOLN
100.0000 mL | Freq: Once | INTRAVENOUS | Status: AC | PRN
  Administered 2023-08-24: 100 mL via INTRAVENOUS

## 2023-09-07 ENCOUNTER — Ambulatory Visit: Admitting: Physician Assistant

## 2023-09-07 DIAGNOSIS — R339 Retention of urine, unspecified: Secondary | ICD-10-CM

## 2023-09-07 DIAGNOSIS — Z435 Encounter for attention to cystostomy: Secondary | ICD-10-CM

## 2023-09-07 NOTE — Progress Notes (Signed)
 Suprapubic Cath Change  Patient is present today for a suprapubic catheter change due to urinary retention.  8ml of water  was drained from the balloon, a 24FR Silastic foley cath was removed from the tract without difficulty.  Site was cleaned and prepped in a sterile fashion with betadine.  A 24FR Silastic foley cath was replaced into the tract no complications were noted. 10 ml of sterile water  was inflated into the balloon and the catheter was plugged.  Patient tolerated well.   Performed by: Averly Ericson, PA-C and Jessica Qualls, CMA  Additional notes: He requested additional catheters and 10cc syringes today so he can reinsert his SPT at home if his catheter "ruptures." He is unable to elaborate on what he means by this or how often his catheter is "rupturing." We discussed that we do not recommend he reinsert himself because he will not be able to set up a sterile field at home. He is adamant that he needs extra supplies so he can reinsert his catheter if he needs to because he lives alone. Supplies provided.  Follow up: Return in about 6 weeks (around 10/19/2023) for SPT exchange.

## 2023-10-19 ENCOUNTER — Ambulatory Visit: Admitting: Physician Assistant

## 2023-10-19 VITALS — BP 139/70 | HR 80 | Ht 72.0 in | Wt 220.0 lb

## 2023-10-19 DIAGNOSIS — R339 Retention of urine, unspecified: Secondary | ICD-10-CM

## 2023-10-19 DIAGNOSIS — Z435 Encounter for attention to cystostomy: Secondary | ICD-10-CM

## 2023-10-19 NOTE — Progress Notes (Signed)
 Suprapubic Cath Change  Patient is present today for a suprapubic catheter change due to urinary retention.  8ml of water  was drained from the balloon, a 24FR Silastic foley cath was removed from the tract without difficulty.  Site was cleaned and prepped in a sterile fashion with betadine.  A 24FR Silastic foley cath was replaced into the tract no complications were noted. Urine return was noted, 10 ml of sterile water  was inflated into the balloon and the catheter was plugged.  Patient tolerated well.    Performed by: Fritzie Prioleau, PA-C  Additional notes: He spontaneously passed a small bladder stone, ~34mm, with catheter removal.  Follow up: Return in about 6 weeks (around 11/30/2023) for SPT exchange.

## 2023-11-30 ENCOUNTER — Ambulatory Visit: Admitting: Physician Assistant

## 2023-12-07 ENCOUNTER — Ambulatory Visit: Admitting: Physician Assistant

## 2023-12-07 ENCOUNTER — Encounter: Payer: Self-pay | Admitting: Physician Assistant

## 2023-12-07 VITALS — Wt 211.8 lb

## 2023-12-07 DIAGNOSIS — R339 Retention of urine, unspecified: Secondary | ICD-10-CM

## 2023-12-07 DIAGNOSIS — Z435 Encounter for attention to cystostomy: Secondary | ICD-10-CM

## 2023-12-07 NOTE — Progress Notes (Signed)
 Suprapubic Cath Change  Patient is present today for a suprapubic catheter change due to urinary retention.  8ml of water  was drained from the balloon, a 24FR Silastic foley cath was removed from the tract without difficulty.  Site was cleaned and prepped in a sterile fashion with betadine.  A 24FR Silastic foley cath was replaced into the tract no complications were noted. 10 ml of sterile water  was inflated into the balloon and catheter was plugged.  Patient tolerated well.  Performed by: Doretha Goding, PA-C and Laymon Ned, CMA  Follow up: Return in about 6 weeks (around 01/18/2024) for SPT exchange.

## 2024-01-19 ENCOUNTER — Ambulatory Visit: Admitting: Physician Assistant

## 2024-01-19 VITALS — BP 154/70 | HR 80 | Ht 72.0 in | Wt 216.1 lb

## 2024-01-19 DIAGNOSIS — Z435 Encounter for attention to cystostomy: Secondary | ICD-10-CM

## 2024-01-19 NOTE — Progress Notes (Signed)
 Suprapubic Cath Change  Patient is present today for a suprapubic catheter change due to urinary retention.  8ml of water  was drained from the balloon, a 24FR Silastic foley cath was removed from the tract without difficulty.  Site was cleaned and prepped in a sterile fashion with betadine.  A 24FR Silastic foley cath was replaced into the tract no complications were noted. Urine return was not noted, 10 ml of sterile water  was inflated into the balloon and catheter was plugged.  Patient tolerated well.   Performed by: Ocie Stanzione, PA-C and Charisse Blackwell, CMA  Follow up: Return in about 6 weeks (around 03/01/2024) for SPT exchange.

## 2024-03-01 ENCOUNTER — Ambulatory Visit (INDEPENDENT_AMBULATORY_CARE_PROVIDER_SITE_OTHER): Admitting: Physician Assistant

## 2024-03-01 DIAGNOSIS — Z435 Encounter for attention to cystostomy: Secondary | ICD-10-CM

## 2024-03-01 NOTE — Progress Notes (Signed)
 Suprapubic Cath Change  Patient is present today for a suprapubic catheter change due to urinary retention.  8ml of water  was drained from the balloon, a 24FR Silastic foley cath was removed from the tract without difficulty.  Site was cleaned and prepped in a sterile fashion with betadine.  A 24FR Silastic foley cath was replaced into the tract no complications were noted. Urine return was noted, 8 ml of sterile water  was inflated into the balloon and catheter was plugged.  Patient tolerated well.   Performed by: Vera Wishart, PA-C and Laymon Ned, CMA  Follow up: Return in about 6 weeks (around 04/12/2024) for SPT exchange.

## 2024-03-31 ENCOUNTER — Ambulatory Visit: Admitting: Physician Assistant

## 2024-04-06 ENCOUNTER — Telehealth: Payer: Self-pay

## 2024-04-06 NOTE — Telephone Encounter (Signed)
 Patient called and left voicemail that his catheter is stopped up. I tried calling back with no answer and no voicemail set up on both phone numbers listed.

## 2024-04-13 NOTE — Telephone Encounter (Signed)
 Spoke with patient and he stated that he does not need 04/20/24 appointment because he changed his catheter out himself on 04/06/24 and is perfectly fine. Patient stated he did this, due to not receiving a return call from our office. I made patient aware that we did try reaching out man times, however, he did not answer and we were unable to leave messages due to not having voice mails set up on either phone. I also asked patient to hold so I can speak with Nurse and he refused. I attempted to schedule another appointment for patient to have another catheter change and patient also refused that. 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 Patient stated that he was done and refused all follow up appointment until further notice.

## 2024-04-19 ENCOUNTER — Ambulatory Visit: Admitting: Physician Assistant

## 2024-04-19 ENCOUNTER — Encounter: Payer: Self-pay | Admitting: Physician Assistant

## 2024-05-31 ENCOUNTER — Other Ambulatory Visit: Payer: Self-pay | Admitting: Internal Medicine

## 2024-05-31 DIAGNOSIS — F4489 Other dissociative and conversion disorders: Secondary | ICD-10-CM

## 2024-06-01 ENCOUNTER — Ambulatory Visit
Admission: RE | Admit: 2024-06-01 | Discharge: 2024-06-01 | Disposition: A | Discharge status: Home / Self Care | Source: Ambulatory Visit | Attending: Internal Medicine | Admitting: Internal Medicine

## 2024-06-01 DIAGNOSIS — F4489 Other dissociative and conversion disorders: Secondary | ICD-10-CM | POA: Diagnosis present

## 2024-06-01 MED ORDER — GADOBUTROL 1 MMOL/ML IV SOLN
9.0000 mL | Freq: Once | INTRAVENOUS | Status: AC | PRN
  Administered 2024-06-01: 9 mL via INTRAVENOUS
